# Patient Record
Sex: Female | Born: 1980 | Race: Black or African American | Hispanic: No | Smoking: Current some day smoker
Health system: Southern US, Community
[De-identification: ages and names within clinical notes are randomized; demographics above are authoritative.]

## PROBLEM LIST (undated history)

## (undated) DIAGNOSIS — Z3490 Encounter for supervision of normal pregnancy, unspecified, unspecified trimester: Secondary | ICD-10-CM

## (undated) DIAGNOSIS — Z5189 Encounter for other specified aftercare: Secondary | ICD-10-CM

## (undated) DIAGNOSIS — D649 Anemia, unspecified: Secondary | ICD-10-CM

## (undated) HISTORY — PX: TUBAL LIGATION: SHX77

## (undated) HISTORY — PX: ABDOMINAL HYSTERECTOMY: SHX81

---

## 2003-11-29 ENCOUNTER — Emergency Department (HOSPITAL_COMMUNITY): Admission: EM | Admit: 2003-11-29 | Discharge: 2003-11-29 | Payer: Self-pay | Admitting: Emergency Medicine

## 2004-06-12 ENCOUNTER — Emergency Department (HOSPITAL_COMMUNITY): Admission: EM | Admit: 2004-06-12 | Discharge: 2004-06-12 | Payer: Self-pay | Admitting: Emergency Medicine

## 2005-04-28 ENCOUNTER — Ambulatory Visit (HOSPITAL_COMMUNITY): Admission: AD | Admit: 2005-04-28 | Discharge: 2005-04-28 | Payer: Self-pay | Admitting: Obstetrics & Gynecology

## 2006-07-23 ENCOUNTER — Emergency Department (HOSPITAL_COMMUNITY): Admission: EM | Admit: 2006-07-23 | Discharge: 2006-07-23 | Payer: Self-pay | Admitting: Emergency Medicine

## 2007-02-19 ENCOUNTER — Emergency Department (HOSPITAL_COMMUNITY): Admission: EM | Admit: 2007-02-19 | Discharge: 2007-02-19 | Payer: Self-pay | Admitting: Emergency Medicine

## 2007-12-25 ENCOUNTER — Emergency Department (HOSPITAL_COMMUNITY): Admission: EM | Admit: 2007-12-25 | Discharge: 2007-12-25 | Payer: Self-pay | Admitting: Emergency Medicine

## 2008-05-05 ENCOUNTER — Emergency Department (HOSPITAL_COMMUNITY): Admission: EM | Admit: 2008-05-05 | Discharge: 2008-05-05 | Payer: Self-pay | Admitting: Emergency Medicine

## 2008-05-26 ENCOUNTER — Emergency Department (HOSPITAL_COMMUNITY): Admission: EM | Admit: 2008-05-26 | Discharge: 2008-05-26 | Payer: Self-pay | Admitting: Emergency Medicine

## 2010-01-05 ENCOUNTER — Emergency Department (HOSPITAL_COMMUNITY): Admission: EM | Admit: 2010-01-05 | Discharge: 2010-01-05 | Payer: Self-pay | Admitting: Emergency Medicine

## 2010-05-23 LAB — IRON AND TIBC
Iron: <10 ug/dL — ABNORMAL LOW (ref 42–135)
UIBC: 446 ug/dL

## 2010-05-23 LAB — COMPREHENSIVE METABOLIC PANEL
ALT: 23 U/L (ref 0–35)
AST: 26 U/L (ref 0–37)
Albumin: 3.6 g/dL (ref 3.5–5.2)
CO2: 27 mEq/L (ref 19–32)
Calcium: 8.7 mg/dL (ref 8.4–10.5)
Chloride: 106 mEq/L (ref 96–112)
Creatinine, Ser: 0.54 mg/dL (ref 0.4–1.2)
GFR calc Af Amer: >60 mL/min (ref >60)
GFR calc non Af Amer: >60 mL/min (ref >60)
Sodium: 137 mEq/L (ref 135–145)

## 2010-05-23 LAB — CBC
MCHC: 30.6 g/dL (ref 30.0–36.0)
Platelets: 154 10*3/uL (ref 150–400)
RBC: 3.52 MIL/uL — ABNORMAL LOW (ref 3.87–5.11)
WBC: 5 10*3/uL (ref 4.0–10.5)

## 2010-05-23 LAB — PREGNANCY, URINE: Preg Test, Ur: NEGATIVE

## 2010-05-23 LAB — URINALYSIS, ROUTINE W REFLEX MICROSCOPIC
Nitrite: NEGATIVE
Protein, ur: NEGATIVE mg/dL
Specific Gravity, Urine: 1.02 (ref 1.005–1.030)
Urobilinogen, UA: 0.2 mg/dL (ref 0.0–1.0)

## 2010-05-23 LAB — DIFFERENTIAL
Band Neutrophils: 0 % (ref 0–10)
Basophils Absolute: 0 10*3/uL (ref 0.0–0.1)
Basophils Relative: 0 % (ref 0–1)
Blasts: 0 %
Eosinophils Absolute: 0.2 10*3/uL (ref 0.0–0.7)
Eosinophils Relative: 4 % (ref 0–5)
Lymphocytes Relative: 33 % (ref 12–46)
Lymphs Abs: 1.7 10*3/uL (ref 0.7–4.0)
Metamyelocytes Relative: 0 %
Monocytes Absolute: 0.4 10*3/uL (ref 0.1–1.0)
Monocytes Relative: 8 % (ref 3–12)

## 2010-05-23 LAB — WET PREP, GENITAL: Yeast Wet Prep HPF POC: NONE SEEN

## 2010-05-23 LAB — VITAMIN B12: Vitamin B-12: 595 pg/mL (ref 211–911)

## 2010-05-23 LAB — FERRITIN: Ferritin: <1 ng/mL — ABNORMAL LOW (ref 10–291)

## 2010-05-23 LAB — RETICULOCYTES: RBC.: 3.39 MIL/uL — ABNORMAL LOW (ref 3.87–5.11)

## 2010-11-29 LAB — COMPREHENSIVE METABOLIC PANEL
ALT: 8
Albumin: 2.9 — ABNORMAL LOW
BUN: 1 — ABNORMAL LOW
Calcium: 8.7
Glucose, Bld: 95
Sodium: 136
Total Protein: 6.2

## 2010-11-29 LAB — RPR: RPR Ser Ql: NONREACTIVE

## 2010-11-29 LAB — WET PREP, GENITAL
Clue Cells Wet Prep HPF POC: NONE SEEN
Yeast Wet Prep HPF POC: NONE SEEN

## 2010-11-29 LAB — CBC
Hemoglobin: 9.3 — ABNORMAL LOW
MCHC: 34
Platelets: 228
RDW: 17.7 — ABNORMAL HIGH

## 2010-11-29 LAB — URINALYSIS, ROUTINE W REFLEX MICROSCOPIC
Glucose, UA: NEGATIVE
Hgb urine dipstick: NEGATIVE
Specific Gravity, Urine: 1.015
pH: 7.5

## 2010-11-29 LAB — DIFFERENTIAL
Lymphs Abs: 1
Monocytes Absolute: 0.4
Monocytes Relative: 7
Neutro Abs: 3.5
Neutrophils Relative %: 71

## 2010-11-29 LAB — GC/CHLAMYDIA PROBE AMP, GENITAL: Chlamydia, DNA Probe: NEGATIVE

## 2011-10-08 ENCOUNTER — Encounter (HOSPITAL_COMMUNITY): Payer: Self-pay | Admitting: Emergency Medicine

## 2011-10-08 ENCOUNTER — Inpatient Hospital Stay (HOSPITAL_COMMUNITY)
Admission: EM | Admit: 2011-10-08 | Discharge: 2011-10-10 | DRG: 812 | Disposition: A | Discharge status: Home / Self Care | Payer: Medicaid Other | Attending: Obstetrics and Gynecology | Admitting: Obstetrics and Gynecology

## 2011-10-08 DIAGNOSIS — D259 Leiomyoma of uterus, unspecified: Secondary | ICD-10-CM | POA: Diagnosis present

## 2011-10-08 DIAGNOSIS — D509 Iron deficiency anemia, unspecified: Secondary | ICD-10-CM | POA: Diagnosis present

## 2011-10-08 DIAGNOSIS — N938 Other specified abnormal uterine and vaginal bleeding: Secondary | ICD-10-CM | POA: Diagnosis present

## 2011-10-08 DIAGNOSIS — D649 Anemia, unspecified: Secondary | ICD-10-CM

## 2011-10-08 DIAGNOSIS — F17200 Nicotine dependence, unspecified, uncomplicated: Secondary | ICD-10-CM | POA: Diagnosis present

## 2011-10-08 DIAGNOSIS — N949 Unspecified condition associated with female genital organs and menstrual cycle: Secondary | ICD-10-CM | POA: Diagnosis present

## 2011-10-08 DIAGNOSIS — R51 Headache: Secondary | ICD-10-CM | POA: Diagnosis present

## 2011-10-08 DIAGNOSIS — D62 Acute posthemorrhagic anemia: Principal | ICD-10-CM | POA: Diagnosis present

## 2011-10-08 DIAGNOSIS — D500 Iron deficiency anemia secondary to blood loss (chronic): Secondary | ICD-10-CM | POA: Diagnosis present

## 2011-10-08 HISTORY — DX: Anemia, unspecified: D64.9

## 2011-10-08 LAB — CBC WITH DIFFERENTIAL/PLATELET
Basophils Relative: 0 % (ref 0–1)
Eosinophils Relative: 2 % (ref 0–5)
HCT: 12.7 % — ABNORMAL LOW (ref 36.0–46.0)
Hemoglobin: 3.4 g/dL — CL (ref 12.0–15.0)
Lymphs Abs: 2.4 10*3/uL (ref 0.7–4.0)
MCV: 64.8 fL — ABNORMAL LOW (ref 78.0–100.0)
Monocytes Relative: 9 % (ref 3–12)
Neutro Abs: 5.7 10*3/uL (ref 1.7–7.7)
Platelets: 52 10*3/uL — ABNORMAL LOW (ref 150–400)
RBC: 1.96 MIL/uL — ABNORMAL LOW (ref 3.87–5.11)
WBC: 9.1 10*3/uL (ref 4.0–10.5)

## 2011-10-08 LAB — IRON AND TIBC: UIBC: 570 ug/dL — ABNORMAL HIGH (ref 125–400)

## 2011-10-08 LAB — COMPREHENSIVE METABOLIC PANEL
Albumin: 3.9 g/dL (ref 3.5–5.2)
Alkaline Phosphatase: 73 U/L (ref 39–117)
BUN: 8 mg/dL (ref 6–23)
CO2: 23 mEq/L (ref 19–32)
Chloride: 103 mEq/L (ref 96–112)
Potassium: 3.8 mEq/L (ref 3.5–5.1)
Total Bilirubin: 0.2 mg/dL — ABNORMAL LOW (ref 0.3–1.2)

## 2011-10-08 LAB — PREGNANCY, URINE: Preg Test, Ur: NEGATIVE

## 2011-10-08 LAB — RETICULOCYTES
RBC.: 1.95 MIL/uL — ABNORMAL LOW (ref 3.87–5.11)
Retic Count, Absolute: 27.3 10*3/uL (ref 19.0–186.0)
Retic Ct Pct: 1.4 % (ref 0.4–3.1)

## 2011-10-08 MED ORDER — DIPHENHYDRAMINE HCL 25 MG PO CAPS
25.0000 mg | ORAL_CAPSULE | Freq: Once | ORAL | Status: AC
  Administered 2011-10-08: 25 mg via ORAL
  Filled 2011-10-08: qty 1

## 2011-10-08 MED ORDER — PRENATAL MULTIVITAMIN CH
1.0000 | ORAL_TABLET | Freq: Every day | ORAL | Status: DC
  Administered 2011-10-09 – 2011-10-10 (×2): 1 via ORAL
  Filled 2011-10-08 (×3): qty 1

## 2011-10-08 MED ORDER — ACETAMINOPHEN 325 MG PO TABS
650.0000 mg | ORAL_TABLET | Freq: Once | ORAL | Status: AC
  Administered 2011-10-08: 650 mg via ORAL
  Filled 2011-10-08: qty 2

## 2011-10-08 MED ORDER — SODIUM CHLORIDE 0.9 % IV SOLN
INTRAVENOUS | Status: DC
  Administered 2011-10-08: 1000 mL via INTRAVENOUS
  Administered 2011-10-09: 20:00:00 via INTRAVENOUS

## 2011-10-08 NOTE — ED Provider Notes (Signed)
History  This chart was scribed for Benny Lennert, MD by Erskine Emery. This patient was seen in room APA01/APA01 and the patient's care was started at 15:09.   CSN: [id number]  Arrival date & time 10/08/11  1210   First MD Initiated Contact with Patient 10/08/11 [phone]      Chief Complaint  Patient presents with  . Vaginal Bleeding    (Consider location/radiation/quality/duration/timing/severity/associated sxs/prior Treatment) [name] [name] is a 31 y.o. female who presents to the Emergency Department complaining of intermittent but gradually worsening vaginal bleeding for the past 2-3 months. Pt reports her LNMP was the first week in July; since then, every 2 weeks she has had profuse vaginal bleeding for about 5-6 days. The current episode (which is the worst yet) started on the 10th of August. Pt reports this time the bleeding has lasted longer, is darker, thicker, and clots are present. Pt reports associated cramping (baseline for her periods), dizziness, fatigue, heart palpitations, and headaches, all aggravated by walking. Pt denies any fever, chills, or abdominal pain. Pt describes her normal periods as heavy and often with clots, but reports these recent episodes of bleeding are much worse. Pt reports she has never seen a physician for this complaint and she is not on birth control because she has had a tubal ligation.  Patient is a 31 y.o. female presenting with vaginal bleeding. The history is provided by the patient. No language interpreter was used.  Vaginal Bleeding This is a recurrent problem. The current episode started more than 1 week ago. The problem occurs constantly. The problem has been gradually worsening. Associated symptoms include headaches. Pertinent negatives include no chest pain and no abdominal pain. The symptoms are aggravated by standing, exertion and walking. Nothing relieves the symptoms. She has tried nothing for the symptoms. The treatment provided no relief.    History reviewed. No pertinent past medical history.  Past Surgical History  Procedure Date  . Tubal ligation     No family history on file.  History  Substance Use Topics  . Smoking status: Current Some Day Smoker  . Smokeless tobacco: Not on file  . Alcohol Use: Yes     occasional    OB History    Grav Para Term Preterm Abortions TAB SAB Ect Mult Living                  Review of Systems  Constitutional: Positive for fatigue. Negative for fever and chills.  HENT: Negative for congestion, sinus pressure and ear discharge.   Eyes: Negative for discharge.  Respiratory: Negative for cough.   Cardiovascular: Positive for palpitations. Negative for chest pain.  Gastrointestinal: Negative for abdominal pain and diarrhea.       Cramping  Genitourinary: Positive for vaginal bleeding. Negative for frequency and hematuria.  Musculoskeletal: Negative for back pain.  Skin: Negative for rash.  Neurological: Positive for dizziness, weakness and headaches. Negative for seizures.  Hematological: Negative.   Psychiatric/Behavioral: Negative for hallucinations.  All other systems reviewed and are negative.    Allergies  Review of patient's allergies indicates no known allergies.  Home Medications  No current outpatient prescriptions on file.  Triage Vitals: BP 113/63  Pulse 102  Temp 98.5 F (36.9 C) (Oral)  Resp 20  Ht 5\' 4"  (1.626 m)  Wt 125 lb (56.7 kg)  BMI 21.46 kg/m2  SpO2 100%  LMP 09/28/2011  Physical Exam  Constitutional: She is oriented to person, place, and time. She appears well-developed.  Looks anemic.   HENT:  Head: Normocephalic and atraumatic.  Eyes: EOM are normal. No scleral icterus.       Conjunctiva are pale  Neck: Neck supple. No thyromegaly present.  Cardiovascular: Regular rhythm.  Tachycardia present.  Exam reveals no gallop and no friction rub.   No murmur heard. Pulmonary/Chest: No stridor. She has no wheezes. She has no rales.  She exhibits no tenderness.  Abdominal: Soft. She exhibits no distension. There is no tenderness. There is no rebound.  Genitourinary: There is bleeding around the vagina. No tenderness around the vagina.       Pelvic exam shows a moderate amount of bleeding and no tenderness.  Musculoskeletal: Normal range of motion. She exhibits no edema.  Lymphadenopathy:    She has no cervical adenopathy.  Neurological: She is oriented to person, place, and time. Coordination normal.  Skin: No rash noted. No erythema.  Psychiatric: She has a normal mood and affect. Her behavior is normal.    ED Course  Procedures (including critical care time) DIAGNOSTIC STUDIES: Oxygen Saturation is 100% on room air, normal by my interpretation.    COORDINATION OF CARE: 15:15--I evaluated the patient and we discussed a treatment plan including blood work, urinalysis, and pelvic exam to which the pt agreed.   16:15--I reviewed the patient's labs, which show her hemoglobin is very low, at 3.4.   16:40--I performed a pelvic examination that shows a moderate amount of bleeding and no tenderness.  Labs Reviewed - No data to display No results found.   No diagnosis found.    MDM      The chart was scribed for me under my direct supervision.  I personally performed the history, physical, and medical decision making and all procedures in the evaluation of this patient.Benny Lennert, MD 10/08/11 Rickey Primus

## 2011-10-08 NOTE — ED Provider Notes (Signed)
[name] [name] is an 31 y.o. female. Have severe anemia with hemoglobin 3.4 at the time when she was seen at [facility] emergency to for fatigue after heavy bleeding x 10 days, the third long menses in the last 50 days. She is a gravida 3 para 3 status post tubal ligation recently moved to [place] from [place] where she still has Medicaid. She has not her medical records in Medicaid to this state.  She has noted progressive limitations in energy and ablility too care for her children.  Pertinent Gynecological History:  Menses: flow is excessive with use of Both pads or tampons on heaviest days  Bleeding: dysfunctional uterine bleeding  Contraception: tubal ligation  DES exposure: unknown  Blood transfusions: none , will be admitted for transfusions at this time  Sexually transmitted diseases: no past history  Previous GYN Procedures: Tubal ligation, last Pap smear 4 years ago, last GYN exam 4 years ago  Last mammogram: Not applicable Date:  Last pap: normal Date: 4 years ago  OB History: G 4, P 4  Menstrual History:  Menarche age  Patient's last menstrual period was 09/28/2011. 3 heavy periods and lasts for 2 days. History of intermittent anemia. Hemoglobin here in April was 6 and previous hemoglobin 2009 was 9.3 patient denies any history of blood disorder   History reviewed. No pertinent past medical history.    Past Surgical History    Procedure  Date    .  Tubal ligation      No family history on file.  Social History: reports that she has been smoking. She does not have any smokeless tobacco history on file. She reports that she drinks alcohol. She reports that she does not use illicit drugs.  On further questioning patient acknowledges cocaine use "2 months ago" she reports that she was a negative test today denies methamphetamine use,or any other illicit substance she specifically denies marijuana use  Allergies: No Known Allergies   (Not in a hospital admission)  ROS    Blood pressure 114/55, pulse 112, temperature 98.5 F (36.9 C), temperature source Oral, resp. rate 20, height 5' 4" (1.626 m), weight 56.7 kg (125 lb), last menstrual period 09/28/2011, SpO2 100.00%.  Physical Exam  Physical Examination: General appearance - alert, well appearing, and in no distress and with complaints of fatigue. Resting comfortably in bed with obvious pallor  Mental status - alert, oriented to person, place, and time, normal mood, behavior, speech, dress, motor activity, and thought processes  Eyes - pupils equal and reactive, extraocular eye movements intact  Mouth - mucous membranes moist, pharynx normal without lesions and Extremely pale mucous membranes without lesion  Abdomen - soft, nontender, nondistended, no masses or organomegaly  Breasts -  Pelvic - VULVA: normal appearing vulva with no masses, tenderness or lesions, VAGINA: normal appearing vagina with normal color and discharge, no lesions, watery dark blood coming from the vagina with clots in the cervix, CERVIX: normal appearing cervix without discharge or lesions, multiparous os, losing very thin watery blood and some clots dark, UTERUS: enlarged to 12 week size, irregular, mobile, consistent with fibroid enlargement of the uterus uterus is very E. elongate midplane nontender, ADNEXA: normal adnexa in size, nontender and no masses, exam chaperoned by registered nurse    Results for orders placed during the hospital encounter of 10/08/11 (from the past 24 hour(s))    PREGNANCY, URINE Status: Normal     Collection Time     10/08/11 3:27 PM      Component  Value  Range     Preg Test, Ur  NEGATIVE  NEGATIVE    CBC WITH DIFFERENTIAL Status: Abnormal     Collection Time     10/08/11 3:31 PM    Component  Value  Range     WBC  9.1  4.0 - 10.5 K/uL     RBC  1.96 (*)  3.87 - 5.11 MIL/uL     Hemoglobin  3.4 (*)  12.0 - 15.0 g/dL     HCT  12.7 (*)  36.0 - 46.0 %     MCV  64.8 (*)  78.0 - 100.0 fL     MCH  17.3 (*)  26.0  - 34.0 pg     MCHC  26.8 (*)  30.0 - 36.0 g/dL     RDW  34.8 (*)  11.5 - 15.5 %     Platelets  52 (*)  150 - 400 K/uL     Neutrophils Relative  63  43 - 77 %     Lymphocytes Relative  26  12 - 46 %     Monocytes Relative  9  3 - 12 %     Eosinophils Relative  2  0 - 5 %     Basophils Relative  0  0 - 1 %     Neutro Abs  5.7  1.7 - 7.7 K/uL     Lymphs Abs  2.4  0.7 - 4.0 K/uL     Monocytes Absolute  0.8  0.1 - 1.0 K/uL     Eosinophils Absolute  0.2  0.0 - 0.7 K/uL     Basophils Absolute  0.0  0.0 - 0.1 K/uL     RBC Morphology  POLYCHROMASIA PRESENT      Smear Review  PLATELET COUNT CONFIRMED BY SMEAR     COMPREHENSIVE METABOLIC PANEL Status: Abnormal     Collection Time     10/08/11 3:31 PM    Component  Value  Range     Sodium  136  135 - 145 mEq/L     Potassium  3.8  3.5 - 5.1 mEq/L     Chloride  103  96 - 112 mEq/L     CO2  23  19 - 32 mEq/L     Glucose, Bld  101 (*)  70 - 99 mg/dL     BUN  8  6 - 23 mg/dL     Creatinine, Ser  0.95  0.50 - 1.10 mg/dL     Calcium  9.2  8.4 - 10.5 mg/dL     Total Protein  7.6  6.0 - 8.3 g/dL     Albumin  3.9  3.5 - 5.2 g/dL     AST  18  0 - 37 U/L     ALT  15  0 - 35 U/L     Alkaline Phosphatase  73  39 - 117 U/L     Total Bilirubin  0.2 (*)  0.3 - 1.2 mg/dL     GFR calc non Af Amer  80 (*)  >90 mL/min     GFR calc Af Amer  >90  >90 mL/min    RETICULOCYTES Status: Abnormal     Collection Time     10/08/11 3:31 PM    Component  Value  Range     Retic Ct Pct  1.4  0.4 - 3.1 %     RBC.  1.95 (*)  3.87 - 5.11   MIL/uL     Retic Count, Manual  27.3  19.0 - 186.0 K/uL    TYPE AND SCREEN Status: Normal (Preliminary result)     Collection Time     10/08/11 4:59 PM    Component  Value  Range     ABO/RH(D)  B POS      Antibody Screen  NEG      Sample Expiration  10/11/2011      Unit Number  [id number]      Blood Component Type  RED CELLS,LR      Unit division  00      Status of Unit  ALLOCATED      Transfusion Status  OK TO TRANSFUSE       Crossmatch Result  Compatible      Unit Number  [id number]      Blood Component Type  RED CELLS,LR      Unit division  00      Status of Unit  ALLOCATED      Transfusion Status  OK TO TRANSFUSE      Crossmatch Result  Compatible     PREPARE RBC (CROSSMATCH) Status: Normal     Collection Time     10/08/11 5:06 PM    Component  Value  Range     Order Confirmation  ORDER PROCESSED BY BLOOD BANK     ABO/RH Status: Normal     Collection Time     10/08/11 5:10 PM    Component  Value  Range     ABO/RH(D)  B POS      No results found.  Assessment/Plan:  Severe anemia, likely due to dysfunctional bleeding, associated with fibroid enlargement of uterine  Plan: Will admit, obtain anemia panel, obtain ultrasound tomorrow, transfuse 4 units packed cells overnight, administer Megace 40 mg by mouth 3 times a day overnight, and move to IV Premarin if bleeding continues tomorrow l  Santino Kinsella V  10/08/2011, 6:31 PM          

## 2011-10-08 NOTE — ED Notes (Signed)
VSS Tolerating blood; No rxn IV assessment WNL

## 2011-10-08 NOTE — ED Notes (Signed)
Pt tolerated first unit of PRBCs without problems. No rx noted

## 2011-10-08 NOTE — ED Notes (Signed)
Pt c/o vaginal bleeding x 10 days with dizziness and malaise.

## 2011-10-08 NOTE — ED Notes (Signed)
Pt assisted to [facility] tolerated well gait weak and pt states slight dizziness. Pt passed multiple clots

## 2011-10-08 NOTE — H&P (Signed)
[name] [name] is an 31 y.o. female. Have severe anemia with hemoglobin 3.4 at the time when she was seen at [facility] emergency to for fatigue after heavy bleeding x 10 days, the third long menses in the last 50 days. She is a gravida 3 para 3 status post tubal ligation recently moved to [place] from [place] where she still has Medicaid. She has not her medical records in [place] to this state.  She has noted progressive limitations in energy and ablility too care for her children.  Pertinent Gynecological History:  Menses: flow is excessive with use of Both pads or tampons on heaviest days  Bleeding: dysfunctional uterine bleeding  Contraception: tubal ligation  DES exposure: unknown  Blood transfusions: none , will be admitted for transfusions at this time  Sexually transmitted diseases: no past history  Previous GYN Procedures: Tubal ligation, last Pap smear 4 years ago, last GYN exam 4 years ago  Last mammogram: Not applicable Date:  Last pap: normal Date: 4 years ago  OB History: G 4, P 4  Menstrual History:  Menarche age  Patient's last menstrual period was 09/28/2011. 3 heavy periods and lasts for 2 days. History of intermittent anemia. Hemoglobin here in April was 6 and previous hemoglobin 2009 was 9.3 patient denies any history of blood disorder   History reviewed. No pertinent past medical history.    Past Surgical History    Procedure  Date    .  Tubal ligation      No family history on file.  Social History: reports that she has been smoking. She does not have any smokeless tobacco history on file. She reports that she drinks alcohol. She reports that she does not use illicit drugs.  On further questioning patient acknowledges cocaine use "2 months ago" she reports that she was a negative test today denies methamphetamine use,or any other illicit substance she specifically denies marijuana use  Allergies: No Known Allergies   (Not in a hospital admission)  ROS    Blood pressure 114/55, pulse 112, temperature 98.5 F (36.9 C), temperature source Oral, resp. rate 20, height 5\' 4"  (1.626 m), weight 56.7 kg (125 lb), last menstrual period 09/28/2011, SpO2 100.00%.  Physical Exam  Physical Examination: General appearance - alert, well appearing, and in no distress and with complaints of fatigue. Resting comfortably in bed with obvious pallor  Mental status - alert, oriented to person, place, and time, normal mood, behavior, speech, dress, motor activity, and thought processes  Eyes - pupils equal and reactive, extraocular eye movements intact  Mouth - mucous membranes moist, pharynx normal without lesions and Extremely pale mucous membranes without lesion  Abdomen - soft, nontender, nondistended, no masses or organomegaly  Breasts -  Pelvic - VULVA: normal appearing vulva with no masses, tenderness or lesions, VAGINA: normal appearing vagina with normal color and discharge, no lesions, watery dark blood coming from the vagina with clots in the cervix, CERVIX: normal appearing cervix without discharge or lesions, multiparous os, losing very thin watery blood and some clots dark, UTERUS: enlarged to 12 week size, irregular, mobile, consistent with fibroid enlargement of the uterus uterus is very E. elongate midplane nontender, ADNEXA: normal adnexa in size, nontender and no masses, exam chaperoned by registered nurse    Results for orders placed during the hospital encounter of 10/08/11 (from the past 24 hour(s))    PREGNANCY, URINE Status: Normal     Collection Time     10/08/11 3:27 PM  Component  Value  Range     Preg Test, Ur  NEGATIVE  NEGATIVE    CBC WITH DIFFERENTIAL Status: Abnormal     Collection Time     10/08/11 3:31 PM    Component  Value  Range     WBC  9.1  4.0 - 10.5 K/uL     RBC  1.96 (*)  3.87 - 5.11 MIL/uL     Hemoglobin  3.4 (*)  12.0 - 15.0 g/dL     HCT  30.8 (*)  65.7 - 46.0 %     MCV  64.8 (*)  78.0 - 100.0 fL     MCH  17.3 (*)  26.0  - 34.0 pg     MCHC  26.8 (*)  30.0 - 36.0 g/dL     RDW  84.6 (*)  96.2 - 15.5 %     Platelets  52 (*)  150 - 400 K/uL     Neutrophils Relative  63  43 - 77 %     Lymphocytes Relative  26  12 - 46 %     Monocytes Relative  9  3 - 12 %     Eosinophils Relative  2  0 - 5 %     Basophils Relative  0  0 - 1 %     Neutro Abs  5.7  1.7 - 7.7 K/uL     Lymphs Abs  2.4  0.7 - 4.0 K/uL     Monocytes Absolute  0.8  0.1 - 1.0 K/uL     Eosinophils Absolute  0.2  0.0 - 0.7 K/uL     Basophils Absolute  0.0  0.0 - 0.1 K/uL     RBC Morphology  POLYCHROMASIA PRESENT      Smear Review  PLATELET COUNT CONFIRMED BY SMEAR     COMPREHENSIVE METABOLIC PANEL Status: Abnormal     Collection Time     10/08/11 3:31 PM    Component  Value  Range     Sodium  136  135 - 145 mEq/L     Potassium  3.8  3.5 - 5.1 mEq/L     Chloride  103  96 - 112 mEq/L     CO2  23  19 - 32 mEq/L     Glucose, Bld  101 (*)  70 - 99 mg/dL     BUN  8  6 - 23 mg/dL     Creatinine, Ser  9.52  0.50 - 1.10 mg/dL     Calcium  9.2  8.4 - 10.5 mg/dL     Total Protein  7.6  6.0 - 8.3 g/dL     Albumin  3.9  3.5 - 5.2 g/dL     AST  18  0 - 37 U/L     ALT  15  0 - 35 U/L     Alkaline Phosphatase  73  39 - 117 U/L     Total Bilirubin  0.2 (*)  0.3 - 1.2 mg/dL     GFR calc non Af Amer  80 (*)  >90 mL/min     GFR calc Af Amer  >90  >90 mL/min    RETICULOCYTES Status: Abnormal     Collection Time     10/08/11 3:31 PM    Component  Value  Range     Retic Ct Pct  1.4  0.4 - 3.1 %     RBC.  1.95 (*)  3.87 - 5.11  MIL/uL     Retic Count, Manual  27.3  19.0 - 186.0 K/uL    TYPE AND SCREEN Status: Normal (Preliminary result)     Collection Time     10/08/11 4:59 PM    Component  Value  Range     ABO/RH(D)  B POS      Antibody Screen  NEG      Sample Expiration  10/11/2011      Unit Number  [id number]      Blood Component Type  RED CELLS,LR      Unit division  00      Status of Unit  ALLOCATED      Transfusion Status  OK TO TRANSFUSE       Crossmatch Result  Compatible      Unit Number  [id number]      Blood Component Type  RED CELLS,LR      Unit division  00      Status of Unit  ALLOCATED      Transfusion Status  OK TO TRANSFUSE      Crossmatch Result  Compatible     PREPARE RBC (CROSSMATCH) Status: Normal     Collection Time     10/08/11 5:06 PM    Component  Value  Range     Order Confirmation  ORDER PROCESSED BY BLOOD BANK     ABO/RH Status: Normal     Collection Time     10/08/11 5:10 PM    Component  Value  Range     ABO/RH(D)  B POS      No results found.  Assessment/Plan:  Severe anemia, likely due to dysfunctional bleeding, associated with fibroid enlargement of uterine  Plan: Will admit, obtain anemia panel, obtain ultrasound tomorrow, transfuse 4 units packed cells overnight, administer Megace 40 mg by mouth 3 times a day overnight, and move to IV Premarin if bleeding continues tomorrow l  Baraka Klatt V  10/08/2011, 6:31 PM

## 2011-10-08 NOTE — ED Notes (Signed)
Attempted to call report RN in an emergent situation with a critical pt RN to call back for report

## 2011-10-08 NOTE — ED Notes (Addendum)
Lab called with critical HGB 3.4 and Hematocrit 12.7  Notified edp

## 2011-10-08 NOTE — Progress Notes (Signed)
Called for report, ER nurse starting IV, will call back.

## 2011-10-08 NOTE — ED Notes (Signed)
No reaction Tolerating blood with out problems IV assessment WNL Education provided

## 2011-10-08 NOTE — ED Notes (Addendum)
Pt tolerating blood without rx. Rate increased to 150 VSS IV site WNL Education provided

## 2011-10-09 ENCOUNTER — Inpatient Hospital Stay (HOSPITAL_COMMUNITY): Payer: Medicaid Other

## 2011-10-09 LAB — CBC
MCH: 25.5 pg — ABNORMAL LOW (ref 26.0–34.0)
MCHC: 32.6 g/dL (ref 30.0–36.0)
MCV: 78 fL (ref 78.0–100.0)

## 2011-10-09 LAB — FOLATE: Folate: 13.7 ng/mL

## 2011-10-09 LAB — FERRITIN: Ferritin: <1 ng/mL — ABNORMAL LOW (ref 10–291)

## 2011-10-09 MED ORDER — MEGESTROL ACETATE 40 MG PO TABS
ORAL_TABLET | ORAL | Status: AC
  Filled 2011-10-09: qty 1

## 2011-10-09 MED ORDER — ONDANSETRON HCL 4 MG/2ML IJ SOLN: 4.0000 mg | Freq: Four times a day (QID) | INTRAMUSCULAR | Status: DC | PRN

## 2011-10-09 MED ORDER — SODIUM CHLORIDE 0.9 % IJ SOLN
INTRAMUSCULAR | Status: AC
  Administered 2011-10-09: 10 mL
  Filled 2011-10-09: qty 3

## 2011-10-09 MED ORDER — MEGESTROL ACETATE 40 MG PO TABS
40.0000 mg | ORAL_TABLET | Freq: Three times a day (TID) | ORAL | Status: DC
  Administered 2011-10-09 – 2011-10-10 (×4): 40 mg via ORAL
  Filled 2011-10-09 (×7): qty 1

## 2011-10-09 MED ORDER — HYDROCODONE-ACETAMINOPHEN 5-325 MG PO TABS
1.0000 | ORAL_TABLET | Freq: Four times a day (QID) | ORAL | Status: DC | PRN
  Administered 2011-10-09: 1 via ORAL
  Filled 2011-10-09: qty 1

## 2011-10-09 MED ORDER — MEGESTROL ACETATE 40 MG PO TABS
40.0000 mg | ORAL_TABLET | Freq: Once | ORAL | Status: AC
  Administered 2011-10-09: 40 mg via ORAL
  Filled 2011-10-09: qty 1

## 2011-10-09 MED ORDER — ESTROGENS CONJUGATED 25 MG IJ SOLR
25.0000 mg | Freq: Four times a day (QID) | INTRAMUSCULAR | Status: DC | PRN
  Administered 2011-10-09 – 2011-10-10 (×4): 25 mg via INTRAVENOUS
  Filled 2011-10-09 (×6): qty 25

## 2011-10-09 NOTE — Progress Notes (Signed)
Subjective:H As not had ultrasound warm to scheduled for this afternoon, with post transfusion CBC is still to be drawn on standpoint. She will be transferred to the routine floor. transfusions could have been performed on the routine floor she will likely not qualify for ICU. and Patient reports continued bleeding. She's been begun on Premarin IV.    Objective: I have reviewed patient's vital signs.     Assessment/Plan: Uterine bleeding with severe anemia, probable uterine fibroids status post transfusion x4 units packed cells. Will transfer to routine floor and the IV Premarin every 6 hours with antiemetic.  LOS: 1 day    Jakaden Ouzts V 10/09/2011, 12:33 PM

## 2011-10-09 NOTE — Care Management Note (Unsigned)
    Page 1 of 1   10/09/2011     2:36:53 PM   CARE MANAGEMENT NOTE 10/09/2011  Patient:  [name] [name], [name] [name]   Account Number:  [id number]  Date Initiated:  10/09/2011  Documentation initiated by:  Sharrie Rothman  Subjective/Objective Assessment:   Pt admitted from home with anemia and vaginal bleeding. Pt is visiting from [place] but is staying with her mother while visiting in [place]. Pt stated that she will be staying here another month to have surgery.     Action/Plan:   CM stressed to pt that she should contact Social Services about possibly having Medicaid switched from [place] to [place] while here. Will follow for any HH/CM needs.   Anticipated DC Date:  10/10/2011   Anticipated DC Plan:  HOME/SELF CARE      DC Planning Services  CM consult      Choice offered to / List presented to:             Status of service:  In process, will continue to follow Medicare Important Message given?   (If response is "NO", the following Medicare IM given date fields will be blank) Date Medicare IM given:   Date Additional Medicare IM given:    Discharge Disposition:    Per UR Regulation:    If discussed at Long Length of Stay Meetings, dates discussed:    Comments:  10/09/11 1436 Arlyss Queen, RN BSN CM

## 2011-10-09 NOTE — Progress Notes (Signed)
Report given to [place], [place]. Patient being transferred to room 212. Patient alert, oriented and in stable condition at the time of transport. Patient being transported in wheelchair to new room.

## 2011-10-09 NOTE — Progress Notes (Signed)
Subjective: Patient reports tolerating PO.  Still passing clots.. She has completed 4 units packed cells and as much better color and energy. Will be scheduled today for ultrasound. Will begin IV Premarin this morning every 6 hours with Phenergan as well  Objective: I have reviewed patient's vital signs.  Vaginal Bleeding: moderate   Assessment/Plan: Dysfunctional uterine bleeding with severe anemia status post 4 units packed cells GYN ultrasound this morning IV Premarin to be initiated will transfer out of ICU and patient unable to be discharged home by midday  LOS: 1 day    [name] [name] V 10/09/2011, 8:37 AM

## 2011-10-09 NOTE — Progress Notes (Signed)
UR Chart Review Completed  

## 2011-10-10 LAB — TYPE AND SCREEN: Unit division: 0

## 2011-10-10 MED ORDER — SODIUM CHLORIDE 0.9 % IJ SOLN
INTRAMUSCULAR | Status: AC
  Administered 2011-10-10: 10 mL
  Filled 2011-10-10: qty 3

## 2011-10-10 MED ORDER — MEGESTROL ACETATE 40 MG PO TABS: 40.0000 mg | ORAL_TABLET | Freq: Three times a day (TID) | ORAL | Status: AC

## 2011-10-10 NOTE — Discharge Summary (Addendum)
Subjective: Patient reports tolerating PO and no problems voiding.  She denies any current bleeding  Objective: I have reviewed patient's vital signs, medications and labs. CBC    Component Value Date/Time   WBC 10.3 10/09/2011 1237   RBC 3.69* 10/09/2011 1237   HGB 9.4* 10/09/2011 1237   HCT 28.8* 10/09/2011 1237   PLT 57* 10/09/2011 1237   MCV 78.0 10/09/2011 1237   MCH 25.5* 10/09/2011 1237   MCHC 32.6 10/09/2011 1237   RDW 34.8* 10/08/2011 1531   LYMPHSABS 2.4 10/08/2011 1531   MONOABS 0.8 10/08/2011 1531   EOSABS 0.2 10/08/2011 1531   BASOSABS 0.0 10/08/2011 1531     General: alert, no distress and sleeping in this morninng GI: soft, non-tender; bowel sounds normal; no masses,  no organomegaly Vaginal Bleeding: none  US Pelvis Complete   Status: Final result       PACS Images     Show images for US Pelvis Complete      Study Result     *RADIOLOGY REPORT*   Clinical Data: Dysfunctional uterine bleeding, anemia.  Bilateral tubal ligation.   TRANSABDOMINAL AND TRANSVAGINAL ULTRASOUND OF PELVIS   Technique:  Both transabdominal and transvaginal ultrasound examinations of the pelvis were performed.  Transabdominal technique was performed for global imaging of the pelvis including uterus, ovaries, adnexal regions, and pelvic cul-de-sac.   It was necessary to proceed with endovaginal exam following the transabdominal exam to visualize the right ovary.   Comparison:  Early OB ultrasound 07/23/2006   Findings: Uterus:  9.0 x 6.7 x 4.9 cm.  Anteverted, anteflexed.  No focal abnormality.   Endometrium: 4 mm.  Uniformly thin and echogenic.  Right ovary: Normal appearance/no adnexal mass  Left ovary: Normal appearance/no adnexal mass  Other Findings:  No free fluid  IMPRESSION: Normal study.  No evidence of pelvic mass or other significant abnormality.   Original Report Authenticated By: Harrel Lemon, M.D.     Authorizing: Tilda Burrow, MD    Assessment/Plan: Resolved Dub Anemia  Acute,  Probable Chronic anemia ,  LOS: 2 days    Mitsuru Dault V 10/10/2011, 10:44 AM

## 2011-10-10 NOTE — Progress Notes (Signed)
Discharge Summary: a/o.vss. Up ad lib. Saline lock removed. No complaints of distress. Discharge instructions given. Prescriptions given. Pt verbalized understanding of instructions. Awaiting for family to arrive for discharge.

## 2012-07-01 ENCOUNTER — Emergency Department (HOSPITAL_COMMUNITY): Payer: Medicaid Other

## 2012-07-01 ENCOUNTER — Encounter (HOSPITAL_COMMUNITY): Payer: Self-pay

## 2012-07-01 ENCOUNTER — Emergency Department (HOSPITAL_COMMUNITY)
Admission: EM | Admit: 2012-07-01 | Discharge: 2012-07-01 | Disposition: A | Discharge status: Home / Self Care | Payer: Medicaid Other | Attending: Emergency Medicine | Admitting: Emergency Medicine

## 2012-07-01 DIAGNOSIS — Z862 Personal history of diseases of the blood and blood-forming organs and certain disorders involving the immune mechanism: Secondary | ICD-10-CM | POA: Insufficient documentation

## 2012-07-01 DIAGNOSIS — B9689 Other specified bacterial agents as the cause of diseases classified elsewhere: Secondary | ICD-10-CM

## 2012-07-01 DIAGNOSIS — O210 Mild hyperemesis gravidarum: Secondary | ICD-10-CM | POA: Insufficient documentation

## 2012-07-01 DIAGNOSIS — O9989 Other specified diseases and conditions complicating pregnancy, childbirth and the puerperium: Secondary | ICD-10-CM | POA: Insufficient documentation

## 2012-07-01 DIAGNOSIS — O99330 Smoking (tobacco) complicating pregnancy, unspecified trimester: Secondary | ICD-10-CM | POA: Insufficient documentation

## 2012-07-01 DIAGNOSIS — N760 Acute vaginitis: Secondary | ICD-10-CM | POA: Insufficient documentation

## 2012-07-01 DIAGNOSIS — Z9851 Tubal ligation status: Secondary | ICD-10-CM | POA: Insufficient documentation

## 2012-07-01 DIAGNOSIS — Z3490 Encounter for supervision of normal pregnancy, unspecified, unspecified trimester: Secondary | ICD-10-CM

## 2012-07-01 DIAGNOSIS — R109 Unspecified abdominal pain: Secondary | ICD-10-CM | POA: Insufficient documentation

## 2012-07-01 DIAGNOSIS — N898 Other specified noninflammatory disorders of vagina: Secondary | ICD-10-CM | POA: Insufficient documentation

## 2012-07-01 DIAGNOSIS — O2390 Unspecified genitourinary tract infection in pregnancy, unspecified trimester: Secondary | ICD-10-CM | POA: Insufficient documentation

## 2012-07-01 DIAGNOSIS — R110 Nausea: Secondary | ICD-10-CM

## 2012-07-01 LAB — CBC
Hemoglobin: 10.9 g/dL — ABNORMAL LOW (ref 12.0–15.0)
MCH: 25.6 pg — ABNORMAL LOW (ref 26.0–34.0)
MCHC: 32.2 g/dL (ref 30.0–36.0)
MCV: 79.3 fL (ref 78.0–100.0)
RBC: 4.26 MIL/uL (ref 3.87–5.11)

## 2012-07-01 LAB — URINALYSIS, ROUTINE W REFLEX MICROSCOPIC
Glucose, UA: NEGATIVE mg/dL
Hgb urine dipstick: NEGATIVE
Ketones, ur: NEGATIVE mg/dL
Leukocytes, UA: NEGATIVE
Protein, ur: NEGATIVE mg/dL
Urobilinogen, UA: 0.2 mg/dL (ref 0.0–1.0)

## 2012-07-01 LAB — PREGNANCY, URINE: Preg Test, Ur: POSITIVE — AB

## 2012-07-01 LAB — WET PREP, GENITAL

## 2012-07-01 MED ORDER — METRONIDAZOLE 500 MG PO TABS: 500.0000 mg | ORAL_TABLET | Freq: Two times a day (BID) | ORAL | Status: DC

## 2012-07-01 MED ORDER — ONDANSETRON HCL 4 MG PO TABS: 4.0000 mg | ORAL_TABLET | Freq: Three times a day (TID) | ORAL | Status: DC | PRN

## 2012-07-01 MED ORDER — PRENATAL COMPLETE 14-0.4 MG PO TABS: 1.0000 | ORAL_TABLET | Freq: Every day | ORAL | Status: DC

## 2012-07-01 MED ORDER — ONDANSETRON HCL 4 MG/2ML IJ SOLN
4.0000 mg | INTRAMUSCULAR | Status: DC | PRN
  Administered 2012-07-01: 4 mg via INTRAVENOUS
  Filled 2012-07-01: qty 2

## 2012-07-01 NOTE — ED Notes (Signed)
Pt complain of nausea. States she has had two positive pregnancy test. Tubes have been tied.

## 2012-07-01 NOTE — ED Notes (Signed)
Pt reports her last normal period march 10-11th, and has taken 2 pregnancy tests that were positive.  She had her tubes tied 5 years ago, has been having ab cramping at times. Is supposed to have her uterus removed at some point because she is having heavy vaginal bleeding at times that required blood transfusions in the past.

## 2012-07-01 NOTE — ED Provider Notes (Signed)
History     CSN: [id number]  Arrival date & time 07/01/12  1749   First MD Initiated Contact with Patient 07/01/12 1801      Chief Complaint  Patient presents with  . Morning Sickness     HPI Pt was seen at 1840.   Per pt, c/o gradual onset and persistence of constant nausea for the past 1 month. Has been associated with vaginal discharge.  Pt states she has taken 2 home pregnancy tests and they were "both positive."  States she occasionally has had a very brief, "sharp, pinching, cramping pain" in her lower abd. Pt endorses hx of tubal ligation and heavy menses, for which she is scheduled to have a hysterectomy. Hx G4P3, LMP approx 04/20/12 with EGA [redacted] weeks and 2/7 days. Denies vaginal bleeding, no pelvic pain, no back/flank pain, no vomiting/diarrhea, no dysuria, no CP/SOB, no fevers.     OB/GYN: Dr. Emelda Fear Past Medical History  Diagnosis Date  . Anemia     Past Surgical History  Procedure Laterality Date  . Tubal ligation       History  Substance Use Topics  . Smoking status: Current Some Day Smoker -- 0.25 packs/day for 5 years    Types: Cigarettes  . Smokeless tobacco: Never Used  . Alcohol Use: Yes     Comment: occasional    OB History   Grav Para Term Preterm Abortions TAB SAB Ect Mult Living   4 3              Review of Systems ROS: Statement: All systems negative except as marked or noted in the HPI; Constitutional: Negative for fever and chills. ; ; Eyes: Negative for eye pain, redness and discharge. ; ; ENMT: Negative for ear pain, hoarseness, nasal congestion, sinus pressure and sore throat. ; ; Cardiovascular: Negative for chest pain, palpitations, diaphoresis, dyspnea and peripheral edema. ; ; Respiratory: Negative for cough, wheezing and stridor. ; ; Gastrointestinal: +nausea. Negative for vomiting, diarrhea, abdominal pain, blood in stool, hematemesis, jaundice and rectal bleeding. . ; ; Genitourinary: Negative for dysuria, flank pain and hematuria. ;  ; GYN:  No vaginal bleeding, +vaginal discharge, no vulvar pain.;; Musculoskeletal: Negative for back pain and neck pain. Negative for swelling and trauma.; ; Skin: Negative for pruritus, rash, abrasions, blisters, bruising and skin lesion.; ; Neuro: Negative for headache, lightheadedness and neck stiffness. Negative for weakness, altered level of consciousness , altered mental status, extremity weakness, paresthesias, involuntary movement, seizure and syncope.       Allergies  Review of patient's allergies indicates no known allergies.  Home Medications  No current outpatient prescriptions on file.  BP 121/75  Pulse 95  Temp(Src) 98.6 F (37 C) (Oral)  Resp 18  Ht 5\' 4"  (1.626 m)  Wt 130 lb (58.968 kg)  BMI 22.3 kg/m2  SpO2 98%  LMP 04/20/2012  Physical Exam 1845: Physical examination:  Nursing notes reviewed; Vital signs and O2 SAT reviewed;  Constitutional: Well developed, Well nourished, Well hydrated, In no acute distress; Head:  Normocephalic, atraumatic; Eyes: EOMI, PERRL, No scleral icterus; ENMT: Mouth and pharynx normal, Mucous membranes moist; Neck: Supple, Full range of motion, No lymphadenopathy; Cardiovascular: Regular rate and rhythm, No murmur, rub, or gallop; Respiratory: Breath sounds clear & equal bilaterally, No rales, rhonchi, wheezes.  Speaking full sentences with ease, Normal respiratory effort/excursion; Chest: Nontender, Movement normal; Abdomen: Soft, Nontender, Nondistended, Normal bowel sounds; Genitourinary: No CVA tenderness. Pelvic exam performed with permission of pt and female  ED tech assist during exam.  External genitalia w/o lesions. Vaginal vault with thick white discharge.  Cervix w/o lesions, not friable, GC/chlam and wet prep obtained and sent to lab.  Bimanual exam w/o CMT, uterine or adnexal tenderness.;; Extremities: Pulses normal, No tenderness, No edema, No calf edema or asymmetry.; Neuro: AA&Ox3, Major CN grossly intact.  Speech clear. Climbs on  and off stretcher easily by herself. Gait steady. No gross focal motor or sensory deficits in extremities.; Skin: Color normal, Warm, Dry.   ED Course  Procedures    MDM  MDM Reviewed: previous chart, nursing note and vitals Reviewed previous: labs Interpretation: labs and ultrasound   Results for ANTOINE, FIALLOS (MRN [id number]) as of 07/01/2012 19:26  Ref. Range 10/08/2011 15:31 10/09/2011 12:37 07/01/2012 18:45  Hemoglobin Latest Range: 12.0-15.0 g/dL 3.4 (LL) 9.4 (L) 40.9 (L)  HCT Latest Range: 36.0-46.0 % 12.7 (L) 28.8 (L) 33.8 (L)     Results for orders placed during the hospital encounter of 07/01/12  WET PREP, GENITAL      Result Value Range   Yeast Wet Prep HPF POC NONE SEEN  NONE SEEN   Trich, Wet Prep NONE SEEN  NONE SEEN   Clue Cells Wet Prep HPF POC FEW (*) NONE SEEN   WBC, Wet Prep HPF POC FEW (*) NONE SEEN  URINALYSIS, ROUTINE W REFLEX MICROSCOPIC      Result Value Range   Color, Urine YELLOW  YELLOW   APPearance CLEAR  CLEAR   Specific Gravity, Urine <1.005 (*) 1.005 - 1.030   pH 6.5  5.0 - 8.0   Glucose, UA NEGATIVE  NEGATIVE mg/dL   Hgb urine dipstick NEGATIVE  NEGATIVE   Bilirubin Urine NEGATIVE  NEGATIVE   Ketones, ur NEGATIVE  NEGATIVE mg/dL   Protein, ur NEGATIVE  NEGATIVE mg/dL   Urobilinogen, UA 0.2  0.0 - 1.0 mg/dL   Nitrite NEGATIVE  NEGATIVE   Leukocytes, UA NEGATIVE  NEGATIVE  PREGNANCY, URINE      Result Value Range   Preg Test, Ur POSITIVE (*) NEGATIVE  HCG, QUANTITATIVE, PREGNANCY      Result Value Range   hCG, Beta Chain, Quant, S 54458 (*) <5 mIU/mL  CBC      Result Value Range   WBC 6.8  4.0 - 10.5 K/uL   RBC 4.26  3.87 - 5.11 MIL/uL   Hemoglobin 10.9 (*) 12.0 - 15.0 g/dL   HCT 81.1 (*) 91.4 - 78.2 %   MCV 79.3  78.0 - 100.0 fL   MCH 25.6 (*) 26.0 - 34.0 pg   MCHC 32.2  30.0 - 36.0 g/dL   RDW 95.6 (*) 21.3 - 08.6 %   Platelets 209  150 - 400 K/uL   US Ob Comp Less 14 Wks 07/01/2012   *RADIOLOGY REPORT*  Clinical Data: Positive  pregnancy test, abdominal cramping, nausea, past history of tubal ligation, question ectopic pregnancy  OBSTETRIC <14 WK [place] AND TRANSVAGINAL OB US  Technique:  Both transabdominal and transvaginal ultrasound examinations were performed for complete evaluation of the gestation as well as the maternal uterus, adnexal regions, and pelvic cul-de-sac.  Transvaginal technique was performed to assess early pregnancy.  Comparison:  Pelvic and transvaginal ultrasound 10/09/2011  Intrauterine gestational sac:  Visualized/normal in shape. Yolk sac: Present Embryo: Present Cardiac Activity: Present Heart Rate: 168 bpm  CRL: 17.2  mm      8 w  1 d         [place] EDC: 02/09/2013  Maternal uterus/adnexae: Physiologic chorioamniotic separation noted. No definite subchorionic hemorrhage. Trace amount of free pelvic fluid. Unremarkable ovaries. No adnexal masses  IMPRESSION: Single live early intrauterine gestation measured at 8 weeks 1 day EGA by crown-rump length. No acute abnormalities.   Original Report Authenticated By: Ulyses Southward, M.D.    2120:  VSS, resps easy, ambulatory with steady gait.  Abd/pelvic exam remains benign.  +IUP on [place].  No vaginal bleeding. EPIC chart reviewed: pt is B positive per labs 09/2011. Will tx for +BV, GC/chlam pending.  Pt wants to go home now.  Dx and testing d/w pt.  Questions answered.  Verb understanding, agreeable to d/c home with outpt f/u.      Laray Anger, DO 07/03/12 1842

## 2012-07-01 NOTE — ED Notes (Signed)
Sitting upright on stretcher in no apparent distress. Color good, w/d. Offers no complaint. Waiting for sono.

## 2012-07-02 LAB — URINE CULTURE
Colony Count: NO GROWTH
Culture: NO GROWTH

## 2012-07-29 ENCOUNTER — Other Ambulatory Visit: Payer: Self-pay | Admitting: Obstetrics & Gynecology

## 2012-07-29 ENCOUNTER — Encounter: Payer: Self-pay | Admitting: *Deleted

## 2012-07-29 DIAGNOSIS — O3680X0 Pregnancy with inconclusive fetal viability, not applicable or unspecified: Secondary | ICD-10-CM

## 2012-07-30 ENCOUNTER — Other Ambulatory Visit: Payer: Self-pay | Admitting: Obstetrics & Gynecology

## 2012-07-30 ENCOUNTER — Ambulatory Visit (INDEPENDENT_AMBULATORY_CARE_PROVIDER_SITE_OTHER): Payer: Medicaid Other

## 2012-07-30 DIAGNOSIS — Z36 Encounter for antenatal screening of mother: Secondary | ICD-10-CM

## 2012-07-30 DIAGNOSIS — O3680X0 Pregnancy with inconclusive fetal viability, not applicable or unspecified: Secondary | ICD-10-CM

## 2012-07-30 NOTE — Progress Notes (Signed)
U/S(12+2wks)-single IUP with +FCA noted, CRL c/w EDD given by U/S at [facility], cx long and closed, bilateral adnexa wnl, NB present, NT=1.30mm

## 2012-08-04 LAB — MATERNAL SCREEN, INTEGRATED #1

## 2012-08-10 ENCOUNTER — Encounter: Payer: Medicaid Other | Admitting: Adult Health

## 2012-09-14 ENCOUNTER — Encounter: Payer: Medicaid Other | Admitting: Adult Health

## 2012-09-14 ENCOUNTER — Encounter: Payer: Self-pay | Admitting: *Deleted

## 2012-09-30 ENCOUNTER — Other Ambulatory Visit (HOSPITAL_COMMUNITY)
Admission: RE | Admit: 2012-09-30 | Discharge: 2012-09-30 | Disposition: A | Discharge status: Home / Self Care | Payer: Medicaid Other | Source: Ambulatory Visit | Attending: Advanced Practice Midwife | Admitting: Advanced Practice Midwife

## 2012-09-30 ENCOUNTER — Encounter: Payer: Self-pay | Admitting: Advanced Practice Midwife

## 2012-09-30 ENCOUNTER — Ambulatory Visit (INDEPENDENT_AMBULATORY_CARE_PROVIDER_SITE_OTHER): Payer: Medicaid Other | Admitting: Advanced Practice Midwife

## 2012-09-30 ENCOUNTER — Other Ambulatory Visit: Payer: Self-pay | Admitting: Advanced Practice Midwife

## 2012-09-30 VITALS — BP 112/72 | Wt 141.0 lb

## 2012-09-30 DIAGNOSIS — Z01419 Encounter for gynecological examination (general) (routine) without abnormal findings: Secondary | ICD-10-CM | POA: Insufficient documentation

## 2012-09-30 DIAGNOSIS — O0932 Supervision of pregnancy with insufficient antenatal care, second trimester: Secondary | ICD-10-CM

## 2012-09-30 DIAGNOSIS — R87810 Cervical high risk human papillomavirus (HPV) DNA test positive: Secondary | ICD-10-CM | POA: Insufficient documentation

## 2012-09-30 DIAGNOSIS — O0930 Supervision of pregnancy with insufficient antenatal care, unspecified trimester: Secondary | ICD-10-CM

## 2012-09-30 DIAGNOSIS — Z3482 Encounter for supervision of other normal pregnancy, second trimester: Secondary | ICD-10-CM

## 2012-09-30 DIAGNOSIS — Z1389 Encounter for screening for other disorder: Secondary | ICD-10-CM

## 2012-09-30 DIAGNOSIS — Z3490 Encounter for supervision of normal pregnancy, unspecified, unspecified trimester: Secondary | ICD-10-CM | POA: Insufficient documentation

## 2012-09-30 DIAGNOSIS — Z331 Pregnant state, incidental: Secondary | ICD-10-CM

## 2012-09-30 DIAGNOSIS — Z113 Encounter for screening for infections with a predominantly sexual mode of transmission: Secondary | ICD-10-CM | POA: Insufficient documentation

## 2012-09-30 DIAGNOSIS — Z1151 Encounter for screening for human papillomavirus (HPV): Secondary | ICD-10-CM | POA: Insufficient documentation

## 2012-09-30 HISTORY — DX: Supervision of pregnancy with insufficient antenatal care, second trimester: O09.32

## 2012-09-30 LAB — POCT URINALYSIS DIPSTICK
Glucose, UA: NEGATIVE
Leukocytes, UA: NEGATIVE
Nitrite, UA: NEGATIVE

## 2012-09-30 NOTE — Progress Notes (Addendum)
  Subjective:    [name] [name] is a G4P3 [redacted]w[redacted]d being seen today for her first obstetrical visit.  Her obstetrical history is significant for nothing.  Pregnancy history fully reviewed.  Pt had a BTL in 2008.  Patient reports backache and hemorrhoids. Nausea, not helped by zofran Filed Vitals:   09/30/12 1345  BP: 112/72  Weight: 141 lb (63.957 kg)    HISTORY: OB History  Gravida Para Term Preterm AB SAB TAB Ectopic Multiple Living  4 3 3       3     # Outcome Date GA Lbr Len/2nd Weight Sex Delivery Anes PTL Lv  4 CUR           3 TRM 12/18/06 [redacted]w[redacted]d  6 lb 5 oz (2.863 kg) M SVD EPI N Y  2 TRM 2007 [redacted]w[redacted]d 08:00 6 lb 7 oz (2.92 kg) F SVD None  Y  1 TRM 04/22/03 [redacted]w[redacted]d  6 lb 5 oz (2.863 kg) M SVD EPI N Y     Past Medical History  Diagnosis Date  . Anemia    Past Surgical History  Procedure Laterality Date  . Tubal ligation     Family History  Problem Relation Age of Onset  . Diabetes Other      Exam       Pelvic Exam:    Perineum: Normal Perineum   Vulva: normal   Vagina:  normal mucosa, normal discharge, no palpable nodules   Uterus   Fundal Height: 21 cm     Cervix: normal   Adnexa: Not palpable   Urinary:  urethral meatus normal    System: Breast:  normal appearance, no masses or tenderness   Skin: normal coloration and turgor, no rashes    Neurologic: oriented, normal, normal mood   Extremities: normal strength, tone, and muscle mass   HEENT PERRLA   Mouth/Teeth mucous membranes moist, pharynx normal without lesions   Neck supple and no masses   Cardiovascular: regular rate and rhythm   Respiratory:  appears well, vitals normal, no respiratory distress, acyanotic, normal RR   Abdomen: soft, non-tender; bowel sounds normal; no masses,  no organomegaly    Several   Non thrombosed hemorrhoids . Pt "thinks they are ugly."      Assessment:    Pregnancy: G4P3 Patient Active Problem List   Diagnosis Date Noted  . Late prenatal care complicating pregnancy  in second trimester 09/30/2012  . Pregnant 09/30/2012  . Anemia 10/08/2011        Plan:     Initial labs drawn. Prenatal vitamins. Problem list reviewed and updated. Genetic Screening discussed Integrated Screen: requested.  Ultrasound discussed; fetal survey: requested. Diclegis samples  Follow up in 1 weeks.  [name] [name] 09/30/2012

## 2012-09-30 NOTE — Addendum Note (Signed)
Addended by: Jacklyn Shell on: 09/30/2012 02:43 PM   Modules accepted: Orders

## 2012-09-30 NOTE — Progress Notes (Signed)
C/o hemorrhoids, lower abdominal pressure, and lower back pain "feels like something eating away at spine." Pt c/o nausea, Zofran not helping. Pt states missed several appt.

## 2012-09-30 NOTE — Patient Instructions (Signed)

## 2012-10-01 LAB — GC/CHLAMYDIA PROBE AMP, URINE
Chlamydia, Swab/Urine, PCR: NEGATIVE
GC Probe Amp, Urine: NEGATIVE

## 2012-10-01 LAB — DRUG SCREEN, URINE, NO CONFIRMATION
Benzodiazepines.: NEGATIVE
Creatinine,U: 127.8 mg/dL
Marijuana Metabolite: NEGATIVE
Phencyclidine (PCP): NEGATIVE
Propoxyphene: NEGATIVE

## 2012-10-01 LAB — URINALYSIS
Bilirubin Urine: NEGATIVE
Glucose, UA: NEGATIVE mg/dL
Hgb urine dipstick: NEGATIVE
Leukocytes, UA: NEGATIVE
pH: 6.5 (ref 5.0–8.0)

## 2012-10-01 LAB — RUBELLA SCREEN: Rubella: 1.46 Index — ABNORMAL HIGH (ref <0.90)

## 2012-10-01 LAB — VARICELLA ZOSTER ANTIBODY, IGG: Varicella IgG: 293.5 Index — ABNORMAL HIGH (ref <135.00)

## 2012-10-02 LAB — URINE CULTURE: Colony Count: NO GROWTH

## 2012-10-02 NOTE — Addendum Note (Signed)
Addended by: Colen Darling on: 10/02/2012 09:06 AM   Modules accepted: Orders

## 2012-10-05 LAB — MATERNAL SCREEN, INTEGRATED #2
AFP MoM: 0.98
AFP, Serum: 82.7 ng/mL
Age risk Down Syndrome: 1:720 {titer}
Estriol Mom: 0.74
Estriol, Free: 1.8 ng/mL
Nuchal Translucency: 1.3 mm
hCG MoM: 0.38

## 2012-10-09 ENCOUNTER — Other Ambulatory Visit: Payer: Medicaid Other

## 2012-10-09 ENCOUNTER — Encounter: Payer: Medicaid Other | Admitting: Obstetrics & Gynecology

## 2012-10-16 ENCOUNTER — Encounter: Payer: Medicaid Other | Admitting: Women's Health

## 2012-10-16 ENCOUNTER — Ambulatory Visit: Payer: Medicaid Other

## 2012-11-16 ENCOUNTER — Encounter (HOSPITAL_COMMUNITY): Payer: Self-pay | Admitting: *Deleted

## 2012-11-16 ENCOUNTER — Emergency Department (HOSPITAL_COMMUNITY)
Admission: EM | Admit: 2012-11-16 | Discharge: 2012-11-16 | Discharge status: Against Medical Advice | Payer: Medicaid Other | Attending: Emergency Medicine | Admitting: Emergency Medicine

## 2012-11-16 DIAGNOSIS — R1110 Vomiting, unspecified: Secondary | ICD-10-CM | POA: Insufficient documentation

## 2012-11-16 DIAGNOSIS — K089 Disorder of teeth and supporting structures, unspecified: Secondary | ICD-10-CM | POA: Insufficient documentation

## 2012-11-16 DIAGNOSIS — K08109 Complete loss of teeth, unspecified cause, unspecified class: Secondary | ICD-10-CM | POA: Insufficient documentation

## 2012-11-16 DIAGNOSIS — F17200 Nicotine dependence, unspecified, uncomplicated: Secondary | ICD-10-CM | POA: Insufficient documentation

## 2012-11-16 HISTORY — DX: Encounter for other specified aftercare: Z51.89

## 2012-11-16 HISTORY — DX: Encounter for supervision of normal pregnancy, unspecified, unspecified trimester: Z34.90

## 2012-11-16 NOTE — ED Notes (Addendum)
Dental pain , pt removed tooth 10 mos ago, thinks a piece of the tooth remains lt mandibular molar, vomiting.  Has been taking a friends amoxicillin, aleve, tylenol.  Now having abd pain due to all the meds she  Is taking.  7 mos pregnant  Denies problems with pregnancy, plans delivery at [facility].  Says she can see dentist tomorrow. No vag bleeding.

## 2013-06-04 ENCOUNTER — Encounter (HOSPITAL_COMMUNITY): Payer: Self-pay | Admitting: *Deleted

## 2013-06-06 LAB — US OB COMP + 14 WK

## 2013-12-13 ENCOUNTER — Encounter (HOSPITAL_COMMUNITY): Payer: Self-pay | Admitting: *Deleted

## 2014-06-06 ENCOUNTER — Emergency Department (HOSPITAL_COMMUNITY): Payer: Medicaid Other

## 2014-06-06 ENCOUNTER — Emergency Department (HOSPITAL_COMMUNITY)
Admission: EM | Admit: 2014-06-06 | Discharge: 2014-06-06 | Disposition: A | Discharge status: Home / Self Care | Payer: Medicaid Other | Attending: Emergency Medicine | Admitting: Emergency Medicine

## 2014-06-06 ENCOUNTER — Encounter (HOSPITAL_COMMUNITY): Payer: Self-pay | Admitting: Emergency Medicine

## 2014-06-06 DIAGNOSIS — N939 Abnormal uterine and vaginal bleeding, unspecified: Secondary | ICD-10-CM

## 2014-06-06 DIAGNOSIS — D649 Anemia, unspecified: Secondary | ICD-10-CM | POA: Insufficient documentation

## 2014-06-06 DIAGNOSIS — Z3202 Encounter for pregnancy test, result negative: Secondary | ICD-10-CM | POA: Insufficient documentation

## 2014-06-06 DIAGNOSIS — Z79899 Other long term (current) drug therapy: Secondary | ICD-10-CM | POA: Diagnosis not present

## 2014-06-06 DIAGNOSIS — Z720 Tobacco use: Secondary | ICD-10-CM | POA: Insufficient documentation

## 2014-06-06 LAB — CBC WITH DIFFERENTIAL/PLATELET
BASOS ABS: 0 10*3/uL (ref 0.0–0.1)
Basophils Relative: 0 % (ref 0–1)
Eosinophils Absolute: 0.2 10*3/uL (ref 0.0–0.7)
Eosinophils Relative: 3 % (ref 0–5)
HCT: 32.3 % — ABNORMAL LOW (ref 36.0–46.0)
Hemoglobin: 9.3 g/dL — ABNORMAL LOW (ref 12.0–15.0)
LYMPHS ABS: 1.5 10*3/uL (ref 0.7–4.0)
Lymphocytes Relative: 25 % (ref 12–46)
MCH: 20.4 pg — ABNORMAL LOW (ref 26.0–34.0)
MCHC: 28.8 g/dL — AB (ref 30.0–36.0)
MCV: 71 fL — ABNORMAL LOW (ref 78.0–100.0)
MONOS PCT: 7 % (ref 3–12)
Monocytes Absolute: 0.4 10*3/uL (ref 0.1–1.0)
NEUTROS PCT: 65 % (ref 43–77)
Neutro Abs: 4 10*3/uL (ref 1.7–7.7)
PLATELETS: 232 10*3/uL (ref 150–400)
RBC: 4.55 MIL/uL (ref 3.87–5.11)
RDW: 18.8 % — ABNORMAL HIGH (ref 11.5–15.5)
WBC: 6.1 10*3/uL (ref 4.0–10.5)

## 2014-06-06 LAB — COMPREHENSIVE METABOLIC PANEL
ALT: 15 U/L (ref 0–35)
ANION GAP: 5 (ref 5–15)
AST: 21 U/L (ref 0–37)
Albumin: 4.2 g/dL (ref 3.5–5.2)
Alkaline Phosphatase: 111 U/L (ref 39–117)
BILIRUBIN TOTAL: 0.2 mg/dL — AB (ref 0.3–1.2)
BUN: 7 mg/dL (ref 6–23)
CO2: 22 mmol/L (ref 19–32)
Calcium: 8.9 mg/dL (ref 8.4–10.5)
Chloride: 109 mmol/L (ref 96–112)
Creatinine, Ser: 0.68 mg/dL (ref 0.50–1.10)
GFR calc non Af Amer: >90 mL/min (ref >90)
Glucose, Bld: 97 mg/dL (ref 70–99)
Potassium: 3.9 mmol/L (ref 3.5–5.1)
Sodium: 136 mmol/L (ref 135–145)
Total Protein: 7.5 g/dL (ref 6.0–8.3)

## 2014-06-06 LAB — URINALYSIS, ROUTINE W REFLEX MICROSCOPIC
Bilirubin Urine: NEGATIVE
GLUCOSE, UA: NEGATIVE mg/dL
HGB URINE DIPSTICK: NEGATIVE
Ketones, ur: NEGATIVE mg/dL
LEUKOCYTES UA: NEGATIVE
NITRITE: NEGATIVE
PH: 6.5 (ref 5.0–8.0)
Protein, ur: NEGATIVE mg/dL
Specific Gravity, Urine: <1.005 — ABNORMAL LOW (ref 1.005–1.030)
UROBILINOGEN UA: 0.2 mg/dL (ref 0.0–1.0)

## 2014-06-06 LAB — POC URINE PREG, ED: Preg Test, Ur: NEGATIVE

## 2014-06-06 MED ORDER — HYDROCODONE-ACETAMINOPHEN 5-325 MG PO TABS
2.0000 | ORAL_TABLET | Freq: Once | ORAL | Status: AC
  Administered 2014-06-06: 2 via ORAL
  Filled 2014-06-06: qty 2

## 2014-06-06 MED ORDER — MEGESTROL ACETATE 40 MG PO TABS: 40.0000 mg | ORAL_TABLET | Freq: Three times a day (TID) | ORAL | Status: DC

## 2014-06-06 MED ORDER — SODIUM CHLORIDE 0.9 % IV BOLUS (SEPSIS)
1000.0000 mL | Freq: Once | INTRAVENOUS | Status: AC
Start: 2014-06-06 — End: 2014-06-06
  Administered 2014-06-06: 1000 mL via INTRAVENOUS

## 2014-06-06 MED ORDER — MEGESTROL ACETATE 40 MG PO TABS
40.0000 mg | ORAL_TABLET | Freq: Once | ORAL | Status: AC
  Administered 2014-06-06: 40 mg via ORAL
  Filled 2014-06-06: qty 1

## 2014-06-06 MED ORDER — MELOXICAM 15 MG PO TABS: 15.0000 mg | ORAL_TABLET | Freq: Every day | ORAL | Status: DC

## 2014-06-06 MED ORDER — ESTROGENS CONJUGATED 25 MG IJ SOLR
25.0000 mg | Freq: Once | INTRAMUSCULAR | Status: AC
  Administered 2014-06-06: 25 mg via INTRAVENOUS
  Filled 2014-06-06: qty 25

## 2014-06-06 MED ORDER — PROMETHAZINE HCL 12.5 MG PO TABS
12.5000 mg | ORAL_TABLET | Freq: Once | ORAL | Status: AC
  Administered 2014-06-06: 12.5 mg via ORAL
  Filled 2014-06-06: qty 1

## 2014-06-06 NOTE — Discharge Instructions (Signed)
Abnormal Uterine Bleeding YOUR BLOOD WORK IS NON-ACUTE AT THIS TIME. PLEASE USE THE MEGACE THREE TIMES DAILY. SEE MD AT [facility] THAT TOOK CARE OF YOUR LAST DELIVERY AS SOON AS POSSIBLE.Abnormal uterine bleeding means bleeding from the vagina that is not your normal menstrual period. This can be:  Bleeding or spotting between periods.  Bleeding after sex (sexual intercourse).  Bleeding that is heavier or more than normal.  Periods that last longer than usual.  Bleeding after menopause. There are many problems that may cause this. Treatment will depend on the cause of the bleeding. Any kind of bleeding that is not normal should be reviewed by your doctor.  HOME CARE Watch your condition for any changes. These actions may lessen any discomfort you are having:  Do not use tampons or douches as told by your doctor.  Change your pads often. You should get regular pelvic exams and Pap tests. Keep all appointments for tests as told by your doctor. GET HELP IF:  You are bleeding for more than 1 week.  You feel dizzy at times. GET HELP RIGHT AWAY IF:   You pass out.  You have to change pads every 15 to 30 minutes.  You have belly pain.  You have a fever.  You become sweaty or weak.  You are passing large blood clots from the vagina.  You feel sick to your stomach (nauseous) and throw up (vomit). MAKE SURE YOU:  Understand these instructions.  Will watch your condition.  Will get help right away if you are not doing well or get worse. Document Released: 11/25/2008 Document Revised: 02/02/2013 Document Reviewed: 08/27/2012 [facility] Patient Information 2015 [place], [place]. This information is not intended to replace advice given to you by your health care provider. Make sure you discuss any questions you have with your health care provider.

## 2014-06-06 NOTE — ED Provider Notes (Signed)
CSN: [id number]     Arrival date & time 06/06/14  0754 History   First MD Initiated Contact with Patient 06/06/14 [phone]     Chief Complaint  Patient presents with  . Vaginal Bleeding     (Consider location/radiation/quality/duration/timing/severity/associated sxs/prior Treatment) HPI Comments: Patient is a 34 year old female who presents to the emergency department with vaginal bleeding. The patient states that she's been having some spotting type bleeding since January 23 when she delivered her last child. She states that over the last 4 or 5 days she's been having increasing bleeding. At this point she is swelling her third or fourth DP and this morning. She states that it seems as though the blood is "gushing out". The patient states that she had a tubal ligation a few years ago, and has had 2 pregnancies to go to full-term since that time. She states since her previous pregnancies, she has not been told of any fibroid problems or any other complications. She required a hospitalization for severe anemia approximately 2 years ago, at which time her hemoglobin was down to 3.4. The patient is very concerned that with this amount of bleeding that she may get into the severe anemia situation again and she presents to the emergency room. She delivered her last child at the [facility]. She states however that she did not go back to the [facility] in [place] for the follow-up that she was told to have. The patient has had 5 pregnancies and 5 deliveries. Is also of note that she had a Depo-Provera shot in January 2016.    Patient is a 34 y.o. female presenting with vaginal bleeding. The history is provided by the patient.  Vaginal Bleeding Associated symptoms: no abdominal pain, no back pain, no dizziness and no dysuria     Past Medical History  Diagnosis Date  . Anemia   . Pregnant   . Blood transfusion without reported diagnosis    Past Surgical History  Procedure Laterality Date   . Tubal ligation     Family History  Problem Relation Age of Onset  . Diabetes Other    History  Substance Use Topics  . Smoking status: Current Some Day Smoker -- 0.50 packs/day for 5 years    Types: Cigarettes  . Smokeless tobacco: Never Used  . Alcohol Use: Yes     Comment: occasional   OB History    Gravida Para Term Preterm AB TAB SAB Ectopic Multiple Living   6 5 5       3      Review of Systems  Constitutional: Negative for activity change.       All ROS Neg except as noted in HPI  HENT: Negative for nosebleeds.   Eyes: Negative for photophobia and discharge.  Respiratory: Negative for cough, shortness of breath and wheezing.   Cardiovascular: Negative for chest pain and palpitations.  Gastrointestinal: Negative for abdominal pain and blood in stool.  Genitourinary: Positive for vaginal bleeding. Negative for dysuria, frequency and hematuria.  Musculoskeletal: Negative for back pain, arthralgias and neck pain.  Skin: Negative.   Neurological: Negative for dizziness, seizures and speech difficulty.  Psychiatric/Behavioral: Negative for hallucinations and confusion.      Allergies  Review of patient's allergies indicates no known allergies.  Home Medications   Prior to Admission medications   Medication Sig Start Date End Date Taking? Authorizing Provider  ondansetron (ZOFRAN) 4 MG tablet Take 1 tablet (4 mg total) by mouth every 8 (eight) hours  as needed for nausea. 07/01/12   Francine Graven, DO  Prenatal Vit-Fe Fumarate-FA (PRENATAL COMPLETE) 14-0.4 MG TABS Take 1 tablet by mouth daily. 07/01/12   Francine Graven, DO   BP 127/73 mmHg  Pulse 83  Temp(Src) 99.6 F (37.6 C) (Oral)  Resp 16  Ht 5\' 4"  (1.626 m)  Wt 125 lb (56.7 kg)  BMI 21.45 kg/m2  SpO2 99%  LMP 06/06/2014 (Exact Date)  Breastfeeding? No Physical Exam  Constitutional: She is oriented to person, place, and time. She appears well-developed and well-nourished.  Non-toxic appearance.  HENT:   Head: Normocephalic.  Right Ear: Tympanic membrane and external ear normal.  Left Ear: Tympanic membrane and external ear normal.  Eyes: EOM and lids are normal. Pupils are equal, round, and reactive to light.  Neck: Normal range of motion. Neck supple. Carotid bruit is not present.  Cardiovascular: Normal rate, regular rhythm, normal heart sounds, intact distal pulses and normal pulses.   Pulmonary/Chest: Breath sounds normal. No respiratory distress.  Abdominal: Soft. Bowel sounds are normal. There is tenderness. There is no guarding.  There is soreness to palpation of the right and left lower quadrants of the abdomen. No distention, no guarding, and no rebound. No CVA tenderness.  Genitourinary:  Chaperone present during the examination. The patient has active bleeding noted. The external structures show no acute rash or other abnormality. There is no mass noted in the vaginal vault. There are noted of clots in the vaginal vault. The os of the cervix is closed. There is active bleeding from the os of the cervix. There is no adnexal mass, and there is no tenderness to movement of the cervix.  Musculoskeletal: Normal range of motion.  Lymphadenopathy:       Head (right side): No submandibular adenopathy present.       Head (left side): No submandibular adenopathy present.    She has no cervical adenopathy.  Neurological: She is alert and oriented to person, place, and time. She has normal strength. No cranial nerve deficit or sensory deficit.  Skin: Skin is warm and dry.  Psychiatric: She has a normal mood and affect. Her speech is normal.  Nursing note and vitals reviewed.   ED Course      Procedures (including critical care time) Labs Review Labs Reviewed - No data to display  Imaging Review No results found.   EKG Interpretation None      MDM  Urine pregnancy test is negative. Complete blood count shows a white blood cell count 6100, hemoglobin is slightly low at 9.3,  hematocrit slightly low at 32.3, platelets are normal at 232,000. There is no shift to the left. Comprehensive metabolic panel is well within normal limits. Urinalysis is within normal limits. Transvaginal ultrasound shows no finding to describe or explain the patient's abnormal uterine bleeding, in particular there are  no fibroids or no other mass visualized. I discussed the case with Dr. Elonda Husky. He suggests the patient received intravenous Premarin, and oral Megace. The patient is to be placed on Megace by prescription, and follow-up with the physicians who took care of her at the [facility] in [place].. The patient's vital signs are stable, there is no evidence of shock or other acute findings at this time. Feel that it is safe for the patient to be discharged home with very close follow-up by the [facility] in [place].    Final diagnoses:  None    *I have reviewed nursing notes, vital signs, and  all appropriate lab and imaging results for this patient.Lily Kocher, PA-C 06/06/14 [place], MD 06/06/14 1352

## 2014-06-06 NOTE — ED Notes (Signed)
Paged Dr. Glo Herring earlier for H. Marshell Levan.  Then called office and he spoke with Dr. Elonda Husky.

## 2014-06-06 NOTE — ED Notes (Signed)
EDP with pt 

## 2014-06-06 NOTE — ED Notes (Signed)
Pt reports heavy vaginal bleeding, with the exception of a few days, pt states she has bled continuously since delivering on Jan 23. Pt has prior hx of severe anemia from bleeding. Pt received Depo injection on Jan 26, has not helped.

## 2014-06-07 LAB — GC/CHLAMYDIA PROBE AMP (~~LOC~~) NOT AT ARMC
CHLAMYDIA, DNA PROBE: NEGATIVE
Neisseria Gonorrhea: NEGATIVE

## 2014-06-07 LAB — HIV ANTIBODY (ROUTINE TESTING W REFLEX): HIV Screen 4th Generation wRfx: NONREACTIVE

## 2014-06-07 LAB — RPR: RPR Ser Ql: NONREACTIVE

## 2015-01-19 IMAGING — US US OB TRANSVAGINAL
1 series · 14 of 28 positions shown · non-contrast
Comparison: Pelvic and transvaginal ultrasound 10/09/2011

CLINICAL DATA: Positive pregnancy test, abdominal cramping, nausea,
past history of tubal ligation, question ectopic pregnancy

OBSTETRIC <14 WK US AND TRANSVAGINAL OB US
TECHNIQUE: Both transabdominal and transvaginal ultrasound
examinations were performed for complete evaluation of the
gestation as well as the maternal uterus, adnexal regions, and
pelvic cul-de-sac.  Transvaginal technique was performed to assess
early pregnancy.

[Series 1: us ob transvaginal · 0.23mm/px · 14 of 97 slices shown]
[im 4/97]
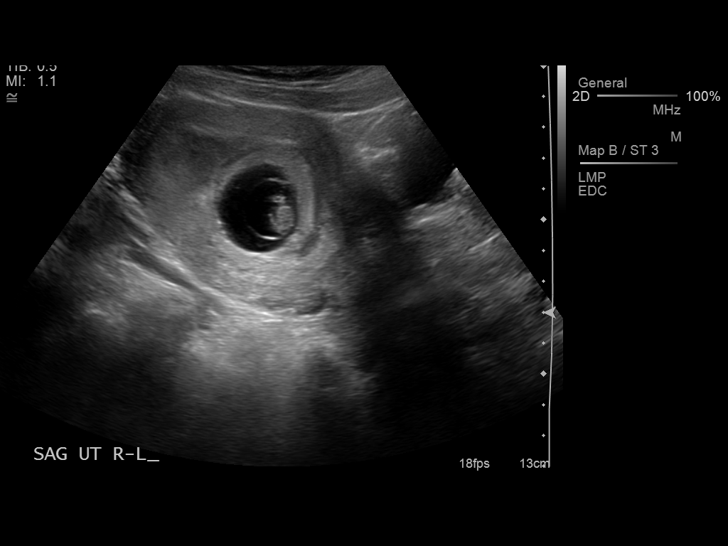
[im 11/97]
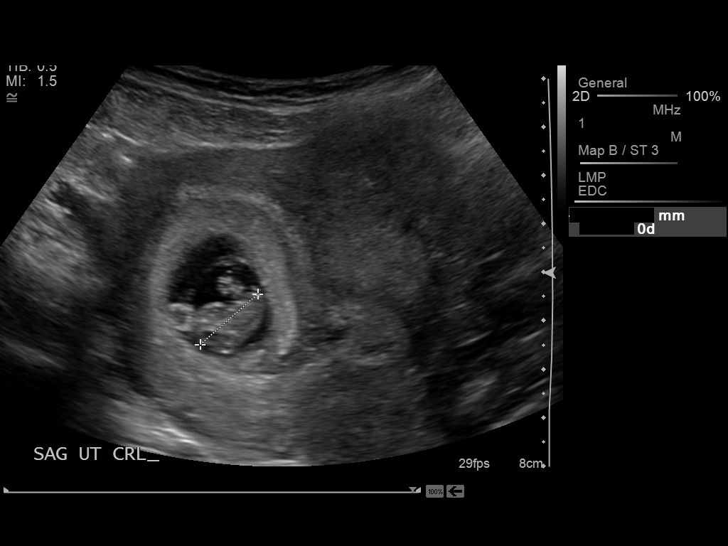
[im 18/97]
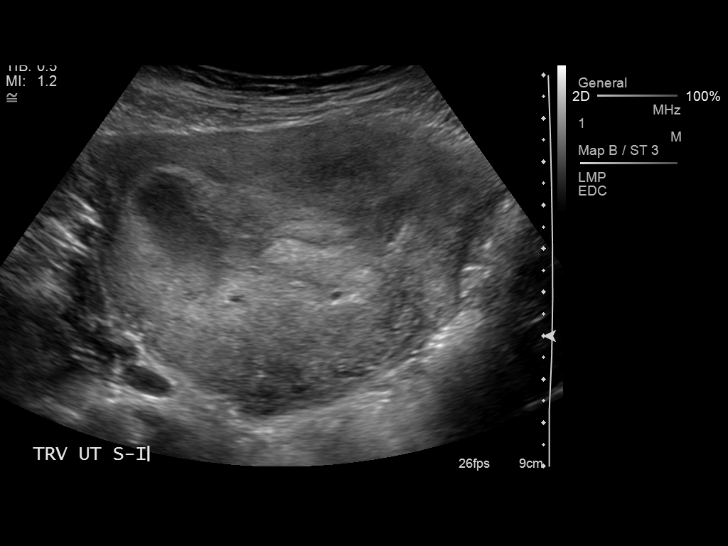
[im 25/97]
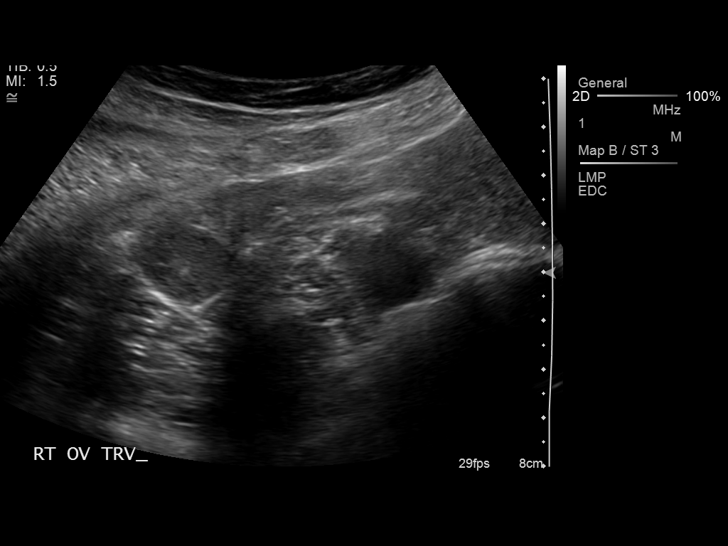
[im 33/97]
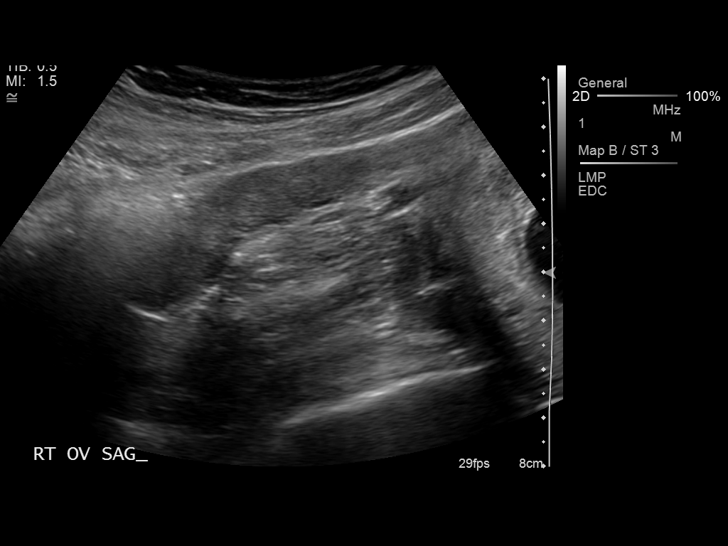
[im 40/97]
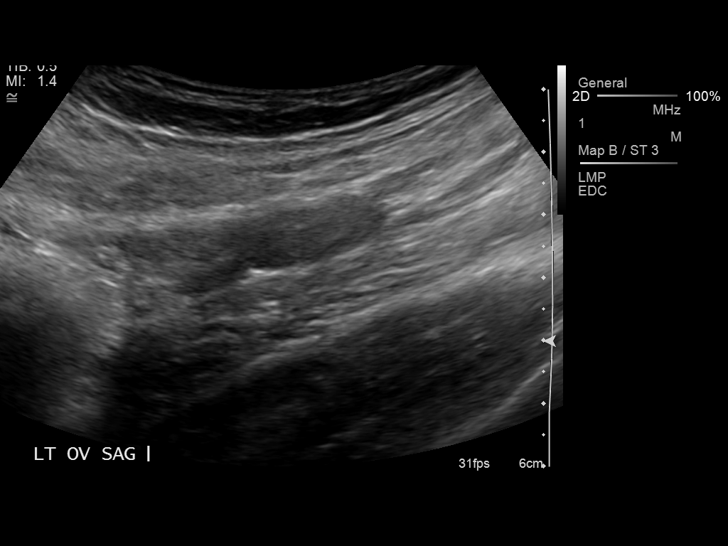
[im 47/97]
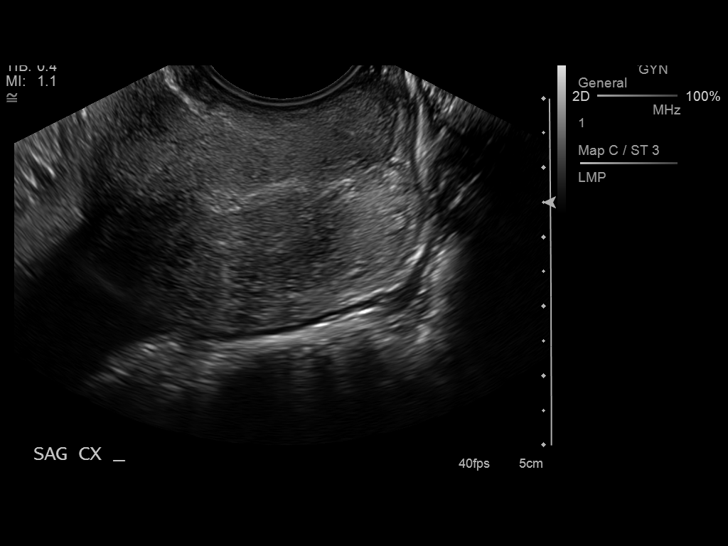
[im 54/97]
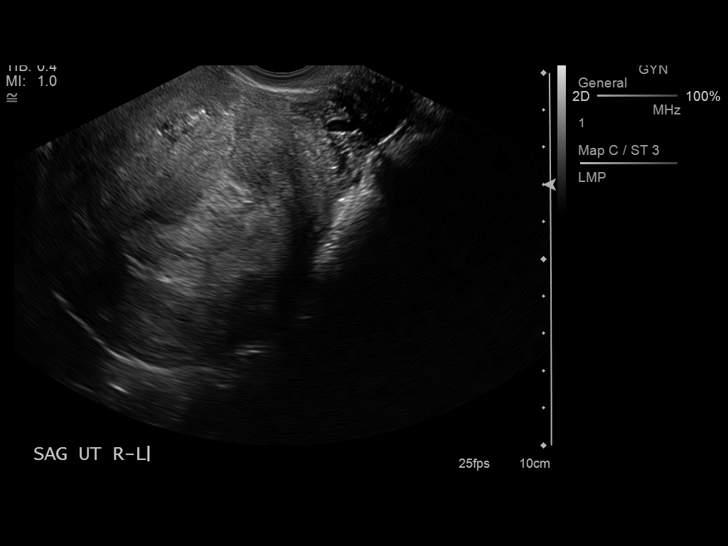
[im 61/97]
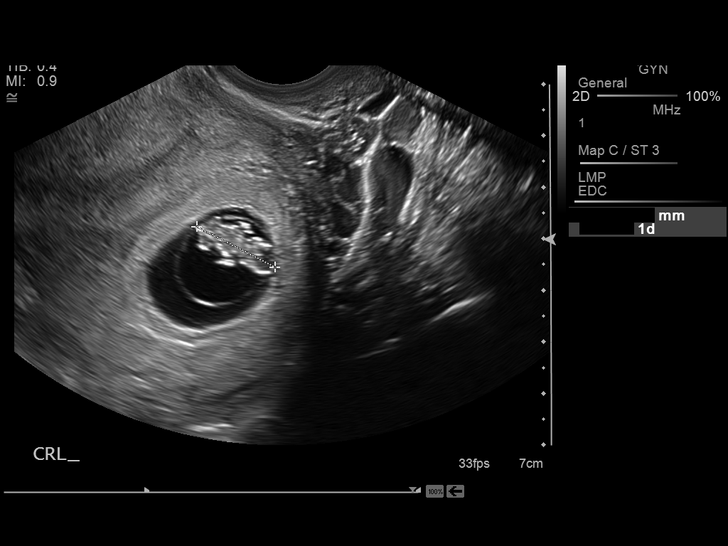
[im 68/97]
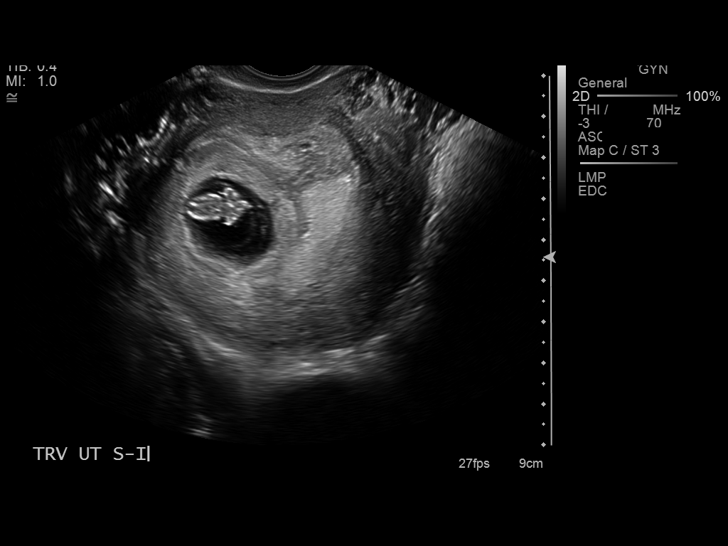
[im 75/97]
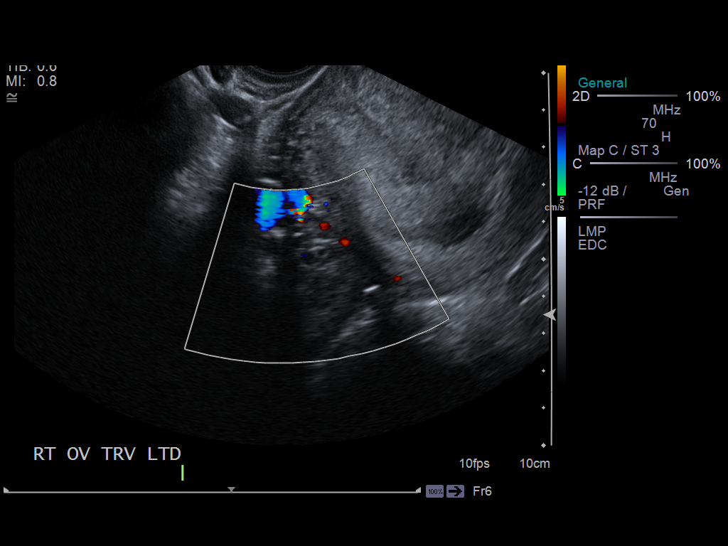
[im 82/97]
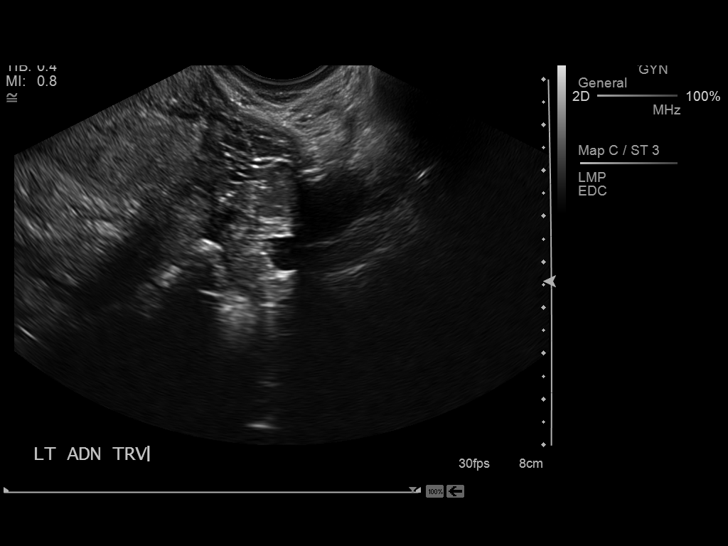
[im 89/97]
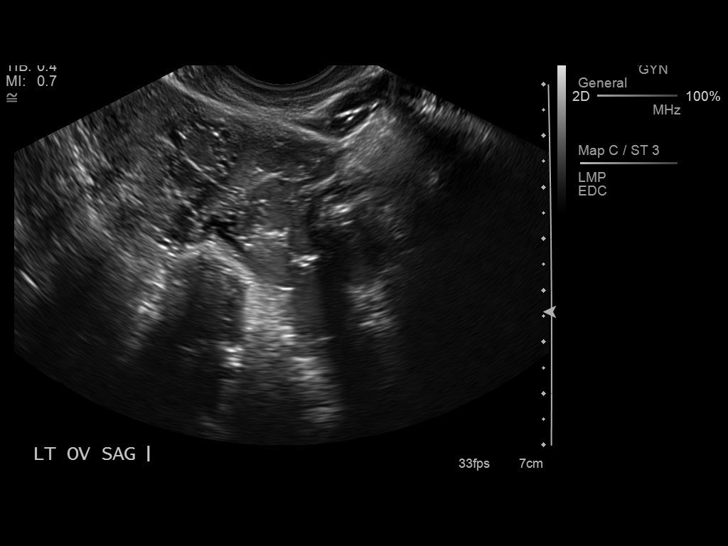
[im 97/97]
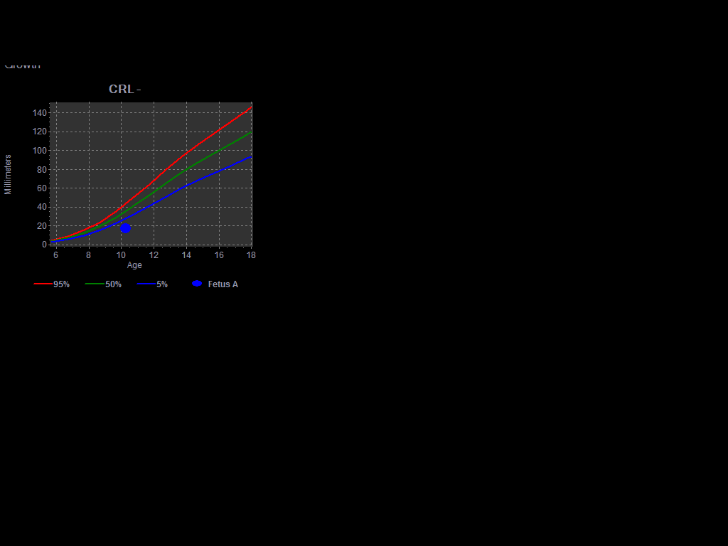

[14 of 28 positions shown; findings below may reference images not displayed]

Intrauterine gestational sac:  Visualized/normal in shape.
Yolk sac: Present
Embryo: Present
Cardiac Activity: Present
Heart Rate: 168 bpm

CRL: 17.2  mm      8 w  1 d         US EDC: 02/09/2013

Maternal uterus/adnexae:
Physiologic chorioamniotic separation noted.
No definite subchorionic hemorrhage.
Trace amount of free pelvic fluid.
Unremarkable ovaries.
No adnexal masses
IMPRESSION: Single live early intrauterine gestation measured at 8 weeks 1 day
EGA by crown-rump length.
No acute abnormalities.

## 2015-06-19 ENCOUNTER — Encounter (HOSPITAL_COMMUNITY): Payer: Self-pay | Admitting: Emergency Medicine

## 2015-06-19 ENCOUNTER — Emergency Department (HOSPITAL_COMMUNITY)
Admission: EM | Admit: 2015-06-19 | Discharge: 2015-06-19 | Disposition: A | Discharge status: Home / Self Care | Payer: Medicaid Other | Attending: Dermatology | Admitting: Dermatology

## 2015-06-19 DIAGNOSIS — R102 Pelvic and perineal pain: Secondary | ICD-10-CM | POA: Diagnosis present

## 2015-06-19 DIAGNOSIS — F17210 Nicotine dependence, cigarettes, uncomplicated: Secondary | ICD-10-CM | POA: Insufficient documentation

## 2015-06-19 DIAGNOSIS — Z5321 Procedure and treatment not carried out due to patient leaving prior to being seen by health care provider: Secondary | ICD-10-CM | POA: Diagnosis not present

## 2015-06-19 NOTE — ED Notes (Addendum)
PT c/o lower abdominal and pelvic pain x2 days and denies any vaginal discharge but states boyfriend had other sexual partner within the past two months. PT denies any urinary symptoms. PT states she had a hysterectomy last year.

## 2016-10-20 ENCOUNTER — Emergency Department (HOSPITAL_COMMUNITY)
Admission: EM | Admit: 2016-10-20 | Discharge: 2016-10-20 | Disposition: A | Discharge status: Home / Self Care | Payer: Medicaid Other | Attending: Emergency Medicine | Admitting: Emergency Medicine

## 2016-10-20 ENCOUNTER — Encounter (HOSPITAL_COMMUNITY): Payer: Self-pay

## 2016-10-20 DIAGNOSIS — N760 Acute vaginitis: Secondary | ICD-10-CM | POA: Diagnosis not present

## 2016-10-20 DIAGNOSIS — Z202 Contact with and (suspected) exposure to infections with a predominantly sexual mode of transmission: Secondary | ICD-10-CM | POA: Diagnosis not present

## 2016-10-20 DIAGNOSIS — F17210 Nicotine dependence, cigarettes, uncomplicated: Secondary | ICD-10-CM | POA: Insufficient documentation

## 2016-10-20 DIAGNOSIS — B9689 Other specified bacterial agents as the cause of diseases classified elsewhere: Secondary | ICD-10-CM

## 2016-10-20 LAB — WET PREP, GENITAL
Sperm: NONE SEEN
Trich, Wet Prep: NONE SEEN
YEAST WET PREP: NONE SEEN

## 2016-10-20 LAB — URINALYSIS, ROUTINE W REFLEX MICROSCOPIC
BILIRUBIN URINE: NEGATIVE
Glucose, UA: NEGATIVE mg/dL
HGB URINE DIPSTICK: NEGATIVE
KETONES UR: NEGATIVE mg/dL
Leukocytes, UA: NEGATIVE
NITRITE: NEGATIVE
PROTEIN: NEGATIVE mg/dL
Specific Gravity, Urine: 1.02 (ref 1.005–1.030)
pH: 6 (ref 5.0–8.0)

## 2016-10-20 LAB — PREGNANCY, URINE: PREG TEST UR: NEGATIVE

## 2016-10-20 MED ORDER — METRONIDAZOLE 500 MG PO TABS: 500.0000 mg | ORAL_TABLET | Freq: Two times a day (BID) | ORAL | 0 refills | Status: AC

## 2016-10-20 NOTE — Discharge Instructions (Signed)
As discussed, avoid sexual intercourse for at least 7 days and until asymptomatic.  You will receive a call if the next few days if any of the labs return positive.  Practice safe sex thereafter using condoms to protect against sexually transmitted infections. Stay well-hydrated and follow up with your Primary care provider. Taking her entire course of antibiotics even if you feel better and avoid alcohol while on this medication.  Return if symptoms worsen, nausea, vomiting, diarrhea, worsening abdominal pain or other concerning symptoms in the meantime.

## 2016-10-20 NOTE — ED Triage Notes (Signed)
Pt reports that her lower abdomen has been cramping since approx Tuesday. Pt noticed vaginal itching for 2 days. Pt recently back with her boyfriend and not sure who he has been with

## 2016-10-20 NOTE — ED Provider Notes (Signed)
[facility] DEPT Provider Note   CSN: [id number] Arrival date & time: 10/20/16  1729     History   Chief Complaint Chief Complaint  Patient presents with  . Abdominal Pain    HPI [name] [name] is a 36 y.o. female presenting with concerns for STD exposure. She reports a history of unprotected intercourse recently. She is having suprapubic discomfort and vaginal pruritus and clear discharge for the last 2 days. Denies dysuria, hematuria, fever, chills, nausea, vomiting, diarrhea or other symptoms. Patient has had hysterectomy. She is unsure is her cervix was preserved.  HPI  Past Medical History:  Diagnosis Date  . Anemia   . Blood transfusion without reported diagnosis   . Pregnant     Patient Active Problem List   Diagnosis Date Noted  . Late prenatal care complicating pregnancy in second trimester 09/30/2012  . Pregnant 09/30/2012  . Anemia 10/08/2011    Past Surgical History:  Procedure Laterality Date  . ABDOMINAL HYSTERECTOMY    . TUBAL LIGATION      OB History    Gravida Para Term Preterm AB Living   6 5 5     5    SAB TAB Ectopic Multiple Live Births           3       Home Medications    Prior to Admission medications   Medication Sig Start Date End Date Taking? Authorizing Provider  metroNIDAZOLE (FLAGYL) 500 MG tablet Take 1 tablet (500 mg total) by mouth 2 (two) times daily. 10/20/16 10/27/16  Emeline General, PA-C    Family History Family History  Problem Relation Age of Onset  . Diabetes Other     Social History Social History  Substance Use Topics  . Smoking status: Current Some Day Smoker    Packs/day: 0.50    Years: 5.00    Types: Cigarettes  . Smokeless tobacco: Never Used  . Alcohol use Yes     Comment: occasional     Allergies   Patient has no known allergies.   Review of Systems Review of Systems  Constitutional: Negative for chills and fever.  Respiratory: Negative for cough, shortness of breath, wheezing and  stridor.   Cardiovascular: Negative for chest pain and palpitations.  Gastrointestinal: Negative for abdominal distention, abdominal pain, diarrhea, nausea and vomiting.  Genitourinary: Positive for pelvic pain and vaginal discharge. Negative for difficulty urinating, dysuria, flank pain, frequency, genital sores, hematuria and menstrual problem.  Musculoskeletal: Negative for arthralgias and back pain.  Skin: Negative for color change, pallor and rash.  Neurological: Negative for seizures and syncope.     Physical Exam Updated Vital Signs BP 119/84 (BP Location: Right Arm)   Pulse 86   Temp 98.2 F (36.8 C) (Oral)   Wt 79.4 kg (175 lb)   LMP 06/06/2014 (Exact Date)   SpO2 99%   BMI 30.04 kg/m   Physical Exam  Constitutional: She appears well-developed and well-nourished. No distress.  Afebrile, nontoxic-appearing, sitting comfortably in bed in no acute distress.  HENT:  Head: Normocephalic and atraumatic.  Eyes: Conjunctivae are normal.  Neck: Neck supple.  Cardiovascular: Normal rate and regular rhythm.   No murmur heard. Pulmonary/Chest: Effort normal and breath sounds normal. No respiratory distress.  Abdominal: Soft. She exhibits no distension and no mass. There is no tenderness. There is no rebound and no guarding.  Genitourinary: Vaginal discharge found.  Genitourinary Comments: Thick white discharge in vaginal vault. Mild suprapubic discomfort on manual exam.  Musculoskeletal: Normal range of motion. She exhibits no edema.  Neurological: She is alert.  Skin: Skin is warm and dry. No rash noted. She is not diaphoretic. No erythema. No pallor.  Psychiatric: She has a normal mood and affect.  Nursing note and vitals reviewed.    ED Treatments / Results  Labs (all labs ordered are listed, but only abnormal results are displayed) Labs Reviewed  WET PREP, GENITAL - Abnormal; Notable for the following:       Result Value   Clue Cells Wet Prep HPF POC PRESENT (*)     WBC, Wet Prep HPF POC MANY (*)    All other components within normal limits  URINALYSIS, ROUTINE W REFLEX MICROSCOPIC  PREGNANCY, URINE  RPR  HIV ANTIBODY (ROUTINE TESTING)  GC/CHLAMYDIA PROBE AMP ([place]) NOT AT [facility]    EKG  EKG Interpretation None       Radiology No results found.  Procedures Procedures (including critical care time)  Medications Ordered in ED Medications - No data to display   Initial Impression / Assessment and Plan / ED Course  I have reviewed the triage vital signs and the nursing notes.  Pertinent labs & imaging results that were available during my care of the patient were reviewed by me and considered in my medical decision making (see chart for details).    Patient presents with concerns for STD exposure. Recent unprotected intercourse. Vaginal pruritus, clear discharge and suprapubic discomfort for 2 days. Hx of hysterectomy  U/A negative WET prep with evidence of BV. Will treat with flagyl. Patient instructed to avoid alcohol with this medication and follow up with her PCP.  Discussed strict return precautions and advised to return to the emergency department if experiencing any new or worsening symptoms. Instructions were understood and patient agreed with discharge plan.  Final Clinical Impressions(s) / ED Diagnoses   Final diagnoses:  Possible exposure to STD  BV (bacterial vaginosis)    New Prescriptions New Prescriptions   METRONIDAZOLE (FLAGYL) 500 MG TABLET    Take 1 tablet (500 mg total) by mouth 2 (two) times daily.     Emeline General, PA-C 10/20/16 Sander Nephew, MD 10/20/16 [phone]

## 2016-10-21 LAB — GC/CHLAMYDIA PROBE AMP (~~LOC~~) NOT AT ARMC
Chlamydia: NEGATIVE
Neisseria Gonorrhea: NEGATIVE

## 2016-10-22 LAB — RPR: RPR Ser Ql: NONREACTIVE

## 2016-10-22 LAB — HIV ANTIBODY (ROUTINE TESTING W REFLEX): HIV SCREEN 4TH GENERATION: NONREACTIVE

## 2016-12-12 ENCOUNTER — Encounter (HOSPITAL_COMMUNITY): Payer: Self-pay

## 2016-12-12 ENCOUNTER — Emergency Department (HOSPITAL_COMMUNITY): Payer: Medicaid Other

## 2016-12-12 ENCOUNTER — Emergency Department (HOSPITAL_COMMUNITY)
Admission: EM | Admit: 2016-12-12 | Discharge: 2016-12-12 | Disposition: A | Discharge status: Home / Self Care | Payer: Medicaid Other | Attending: Emergency Medicine | Admitting: Emergency Medicine

## 2016-12-12 DIAGNOSIS — F17210 Nicotine dependence, cigarettes, uncomplicated: Secondary | ICD-10-CM | POA: Insufficient documentation

## 2016-12-12 DIAGNOSIS — Y929 Unspecified place or not applicable: Secondary | ICD-10-CM | POA: Diagnosis not present

## 2016-12-12 DIAGNOSIS — Y999 Unspecified external cause status: Secondary | ICD-10-CM | POA: Insufficient documentation

## 2016-12-12 DIAGNOSIS — X58XXXA Exposure to other specified factors, initial encounter: Secondary | ICD-10-CM | POA: Insufficient documentation

## 2016-12-12 DIAGNOSIS — M545 Low back pain: Secondary | ICD-10-CM | POA: Diagnosis present

## 2016-12-12 DIAGNOSIS — S29019A Strain of muscle and tendon of unspecified wall of thorax, initial encounter: Secondary | ICD-10-CM

## 2016-12-12 DIAGNOSIS — Y939 Activity, unspecified: Secondary | ICD-10-CM | POA: Diagnosis not present

## 2016-12-12 LAB — POC URINE PREG, ED: Preg Test, Ur: NEGATIVE

## 2016-12-12 MED ORDER — ONDANSETRON HCL 4 MG PO TABS
4.0000 mg | ORAL_TABLET | Freq: Once | ORAL | Status: AC
  Administered 2016-12-12: 4 mg via ORAL
  Filled 2016-12-12: qty 1

## 2016-12-12 MED ORDER — IBUPROFEN 800 MG PO TABS
800.0000 mg | ORAL_TABLET | Freq: Once | ORAL | Status: AC
  Administered 2016-12-12: 800 mg via ORAL
  Filled 2016-12-12: qty 1

## 2016-12-12 MED ORDER — CYCLOBENZAPRINE HCL 10 MG PO TABS
10.0000 mg | ORAL_TABLET | Freq: Once | ORAL | Status: AC
  Administered 2016-12-12: 10 mg via ORAL
  Filled 2016-12-12: qty 1

## 2016-12-12 MED ORDER — CYCLOBENZAPRINE HCL 10 MG PO TABS: ORAL_TABLET | ORAL | 0 refills | Status: DC

## 2016-12-12 MED ORDER — IBUPROFEN 600 MG PO TABS: 600.0000 mg | ORAL_TABLET | Freq: Four times a day (QID) | ORAL | 0 refills | Status: DC

## 2016-12-12 NOTE — ED Provider Notes (Signed)
[facility] EMERGENCY DEPARTMENT Provider Note   CSN: [id number] Arrival date & time: 12/12/16  1606     History   Chief Complaint Chief Complaint  Patient presents with  . Back Pain    HPI [name] [name] is a 36 y.o. female.   Back Pain   This is a new problem. The current episode started yesterday. The problem occurs constantly. The problem has been gradually worsening. The pain is associated with lifting heavy objects. The pain is present in the lumbar spine. The quality of the pain is described as shooting. The pain is moderate. Associated symptoms include numbness. Pertinent negatives include no chest pain, no fever, no abdominal pain, no bowel incontinence, no perianal numbness, no bladder incontinence and no dysuria. She has tried bed rest for the symptoms.    Past Medical History:  Diagnosis Date  . Anemia   . Blood transfusion without reported diagnosis   . Pregnant     Patient Active Problem List   Diagnosis Date Noted  . Late prenatal care complicating pregnancy in second trimester 09/30/2012  . Pregnant 09/30/2012  . Anemia 10/08/2011    Past Surgical History:  Procedure Laterality Date  . ABDOMINAL HYSTERECTOMY    . TUBAL LIGATION      OB History    Gravida Para Term Preterm AB Living   6 5 5     5    SAB TAB Ectopic Multiple Live Births           3       Home Medications    Prior to Admission medications   Not on File    Family History Family History  Problem Relation Age of Onset  . Diabetes Other     Social History Social History  Substance Use Topics  . Smoking status: Current Some Day Smoker    Packs/day: 0.50    Years: 5.00    Types: Cigarettes  . Smokeless tobacco: Never Used  . Alcohol use Yes     Comment: occasional     Allergies   Patient has no known allergies.   Review of Systems Review of Systems  Constitutional: Negative for activity change and fever.       All ROS Neg except as noted in HPI  HENT: Negative  for nosebleeds.   Eyes: Negative for photophobia and discharge.  Respiratory: Negative for cough, shortness of breath and wheezing.   Cardiovascular: Negative for chest pain and palpitations.  Gastrointestinal: Negative for abdominal pain, blood in stool and bowel incontinence.  Genitourinary: Negative for bladder incontinence, dysuria, frequency and hematuria.  Musculoskeletal: Positive for back pain. Negative for arthralgias and neck pain.  Skin: Negative.   Neurological: Positive for numbness. Negative for dizziness, seizures and speech difficulty.  Psychiatric/Behavioral: Negative for confusion and hallucinations.     Physical Exam Updated Vital Signs BP 123/78 (BP Location: Right Arm)   Pulse 90   Temp 97.9 F (36.6 C) (Oral)   Resp 15   Ht 5\' 4"  (1.626 m)   Wt 79.4 kg (175 lb)   LMP 06/06/2014 (Exact Date)   SpO2 99%   BMI 30.04 kg/m   Physical Exam  Constitutional: She is oriented to person, place, and time. She appears well-developed and well-nourished.  Non-toxic appearance.  HENT:  Head: Normocephalic.  Right Ear: Tympanic membrane and external ear normal.  Left Ear: Tympanic membrane and external ear normal.  Eyes: Pupils are equal, round, and reactive to light. EOM and lids are normal.  Neck: Normal range of motion. Neck supple. Carotid bruit is not present.  Cardiovascular: Normal rate, regular rhythm, normal heart sounds, intact distal pulses and normal pulses.   Pulmonary/Chest: Breath sounds normal. No respiratory distress.  Abdominal: Soft. Bowel sounds are normal. There is no tenderness. There is no guarding.  Musculoskeletal: Normal range of motion.       Thoracic back: She exhibits pain.       Back:  Lymphadenopathy:       Head (right side): No submandibular adenopathy present.       Head (left side): No submandibular adenopathy present.    She has no cervical adenopathy.  Neurological: She is alert and oriented to person, place, and time. She has  normal strength. No cranial nerve deficit or sensory deficit.  Skin: Skin is warm and dry.  Psychiatric: She has a normal mood and affect. Her speech is normal.  Nursing note and vitals reviewed.    ED Treatments / Results  Labs (all labs ordered are listed, but only abnormal results are displayed) Labs Reviewed - No data to display  EKG  EKG Interpretation None       Radiology No results found.  Procedures Procedures (including critical care time)  Medications Ordered in ED Medications - No data to display   Initial Impression / Assessment and Plan / ED Course  I have reviewed the triage vital signs and the nursing notes.  Pertinent labs & imaging results that were available during my care of the patient were reviewed by me and considered in my medical decision making (see chart for details).       Final Clinical Impressions(s) / ED Diagnoses MDM Patient reports she was bending over at work and now has pain of her back.  Patient reports pain mostly in the thoracic area. No cough, SOB,or lung related problem. X-ray of the thoracic spine is negative for any compression fracture, dislocation, or other complications.  There are no gross neurologic deficits appreciated on examination at this time.  The patient will be asked to use a heating pad to the area.  She is asked to rest her back as much as possible.  Prescription for Flexeril and ibuprofen given to the patient.  The patient will follow up with orthopedics if not improving.   Final diagnoses:  Strain of thoracic region, initial encounter    New Prescriptions New Prescriptions   No medications on file     Annette Stable 12/14/16 1913    Noemi Chapel, MD 12/14/16 2000

## 2016-12-12 NOTE — Discharge Instructions (Signed)
Your nerve and neurologic examination is normal.  The x-ray of your thoracic spine is normal.  I suspect that you have muscle strain involving the thoracic area.  Please use a heating pad to the area player.  Please rest her back as much as possible.  Use ibuprofen with breakfast, lunch, dinner, and at bedtime.  Please use Flexeril at bedtime for spasm pain, or every 6 hours for severe spasm pain.This medication may cause drowsiness. Please do not drink, drive, or participate in activity that requires concentration while taking this medication.

## 2016-12-12 NOTE — ED Triage Notes (Signed)
Reports of working yesterday, bent over and now having back pain.

## 2018-03-10 ENCOUNTER — Other Ambulatory Visit: Payer: Self-pay

## 2018-03-10 ENCOUNTER — Emergency Department (HOSPITAL_COMMUNITY)
Admission: EM | Admit: 2018-03-10 | Discharge: 2018-03-11 | Disposition: A | Discharge status: Home / Self Care | Payer: Medicaid Other | Attending: Emergency Medicine | Admitting: Emergency Medicine

## 2018-03-10 DIAGNOSIS — F17210 Nicotine dependence, cigarettes, uncomplicated: Secondary | ICD-10-CM | POA: Diagnosis not present

## 2018-03-10 DIAGNOSIS — J029 Acute pharyngitis, unspecified: Secondary | ICD-10-CM | POA: Insufficient documentation

## 2018-03-10 DIAGNOSIS — J020 Streptococcal pharyngitis: Secondary | ICD-10-CM

## 2018-03-10 LAB — CBC WITH DIFFERENTIAL/PLATELET
ABS IMMATURE GRANULOCYTES: 0.09 10*3/uL — AB (ref 0.00–0.07)
Basophils Absolute: 0 10*3/uL (ref 0.0–0.1)
Basophils Relative: 0 %
Eosinophils Absolute: 0.1 10*3/uL (ref 0.0–0.5)
Eosinophils Relative: 1 %
HCT: 38.4 % (ref 36.0–46.0)
Hemoglobin: 12.1 g/dL (ref 12.0–15.0)
Immature Granulocytes: 1 %
Lymphocytes Relative: 14 %
Lymphs Abs: 2.2 10*3/uL (ref 0.7–4.0)
MCH: 30 pg (ref 26.0–34.0)
MCHC: 31.5 g/dL (ref 30.0–36.0)
MCV: 95.3 fL (ref 80.0–100.0)
Monocytes Absolute: 1.1 10*3/uL — ABNORMAL HIGH (ref 0.1–1.0)
Monocytes Relative: 7 %
Neutro Abs: 11.6 10*3/uL — ABNORMAL HIGH (ref 1.7–7.7)
Neutrophils Relative %: 77 %
Platelets: 224 10*3/uL (ref 150–400)
RBC: 4.03 MIL/uL (ref 3.87–5.11)
RDW: 13.5 % (ref 11.5–15.5)
WBC: 15.2 10*3/uL — ABNORMAL HIGH (ref 4.0–10.5)
nRBC: 0 % (ref 0.0–0.2)

## 2018-03-10 LAB — MONONUCLEOSIS SCREEN: Mono Screen: NEGATIVE

## 2018-03-10 LAB — GROUP A STREP BY PCR: Group A Strep by PCR: DETECTED — AB

## 2018-03-10 MED ORDER — SODIUM CHLORIDE 0.9 % IV BOLUS
1000.0000 mL | Freq: Once | INTRAVENOUS | Status: AC
  Administered 2018-03-10: 1000 mL via INTRAVENOUS

## 2018-03-10 MED ORDER — DEXAMETHASONE SODIUM PHOSPHATE 10 MG/ML IJ SOLN
10.0000 mg | Freq: Once | INTRAMUSCULAR | Status: AC
  Administered 2018-03-10: 10 mg via INTRAMUSCULAR
  Filled 2018-03-10: qty 1

## 2018-03-10 MED ORDER — IBUPROFEN 200 MG PO TABS
600.0000 mg | ORAL_TABLET | Freq: Once | ORAL | Status: AC
  Administered 2018-03-10: 600 mg via ORAL
  Filled 2018-03-10: qty 3

## 2018-03-10 MED ORDER — LIDOCAINE VISCOUS HCL 2 % MT SOLN
15.0000 mL | Freq: Once | OROMUCOSAL | Status: AC
  Administered 2018-03-10: 15 mL via OROMUCOSAL
  Filled 2018-03-10: qty 15

## 2018-03-10 NOTE — ED Notes (Signed)
Spoke to lab about blood work, tubes will be processed now.

## 2018-03-10 NOTE — ED Provider Notes (Signed)
38 year old female received at signout from [facility] pending CT scan. Per her HPI:   "[name] [name] is a 38 y.o. female with history of anemia, who presents to the emergency department for evaluation of sore throat.  She reports she has had issues with sore throat over the past 3 months.  Symptoms initially started in November when she had 4 to 5 days of severe sore throat with no other associated symptoms, this seemed to improve but never completely resolved, she went to an urgent care where they did a strep throat swab which was negative.  She reports that a few weeks later sore throat seem to worsen again this time she had associated rhinorrhea, nasal congestion and cough and chalked it up to maybe being a virus or flulike illness, once again symptoms seem to somewhat resolved but sore throat never completely went away and over the past 2 to 3 days she has had a worsening of her sore throat again.  She reports this time it is worse than ever was previously.  She reports it is extremely painful to swallow and she feels like her voice sounds muffled.  She has had some chills but has not taken her temperature at home.  She also reports pain and swelling particularly over the right side of her neck.  She reports that she has been taking some Keflex that was given to her for a friend and thinks she was also on another antibiotic back in November but is unsure the name of this.  Reports she is also been using ibuprofen, Tylenol and Chloraseptic throat spray.  Denies any other aggravating or alleviating factors, no known sick contacts, patient also expresses concern that she could possibly have mono.  Also reports she was recently in a relationship, was sexually active and wonders if she could have gonorrhea in her throat."  Physical Exam  BP 108/64   Pulse (!) 103   Temp 98.7 F (37.1 C) (Oral)   Resp 16   Ht 5\' 4"  (1.626 m)   Wt 83.9 kg   LMP 06/06/2014 (Exact Date)   SpO2 98%   BMI 31.76 kg/m    Physical Exam   Speaks in complete, fluent sentences.  No drooling.  No trismus.  Tolerating fluids without difficulty.  ED Course/Procedures     Procedures  MDM   38 year old female with a history of anemia received at signout from [facility] pending CT soft tissue of the neck.  She has had a sore throat for the last 3 months that is worsened over the last few days.  Please see [facility] for further history, physical exam, medical decision making.  Mono screen is negative.  Strep PCR is positive; treated with PCN in the ED.  Potassium is 3.1 and was replenished in the ED.  She has a leukocytosis of 15 with an elevated neutrophil count of 11.6.  Of note, she also has an elevated monocyte count and increased immature granulocytes.  CT scan is consistent with pharyngitis/tonsillitis, but no peritonsillar abscess or epiglottitis.  She has been tolerating fluids in the ED.  She reports that she is feeling much better.  Given prolonged sore throat, will discharge with a referral to ENT and symptomatic treatment.  Strict return precautions given.  She is hemodynamically stable and in no acute distress.  She is safe for discharge to home with outpatient follow-up at this time.     [name] A, PA-C 03/11/18 0122    Ward,  Delice Bison, DO 03/11/18 0154

## 2018-03-10 NOTE — ED Triage Notes (Signed)
Pt reports she has a sore throat x3 months. Pt has had 2 rounds of antibiotics and is concerned about mono.  Pt has no fever today per EMS.

## 2018-03-10 NOTE — ED Provider Notes (Signed)
[facility] DEPT Provider Note   CSN: [id number] Arrival date & time: 03/10/18  1536     History   Chief Complaint Chief Complaint  Patient presents with  . Sore Throat    HPI [name] [name] is a 38 y.o. female.  [name] [name] is a 38 y.o. female with history of anemia, who presents to the emergency department for evaluation of sore throat.  She reports she has had issues with sore throat over the past 3 months.  Symptoms initially started in November when she had 4 to 5 days of severe sore throat with no other associated symptoms, this seemed to improve but never completely resolved, she went to an urgent care where they did a strep throat swab which was negative.  She reports that a few weeks later sore throat seem to worsen again this time she had associated rhinorrhea, nasal congestion and cough and chalked it up to maybe being a virus or flulike illness, once again symptoms seem to somewhat resolved but sore throat never completely went away and over the past 2 to 3 days she has had a worsening of her sore throat again.  She reports this time it is worse than ever was previously.  She reports it is extremely painful to swallow and she feels like her voice sounds muffled.  She has had some chills but has not taken her temperature at home.  She also reports pain and swelling particularly over the right side of her neck.  She reports that she has been taking some Keflex that was given to her for a friend and thinks she was also on another antibiotic back in November but is unsure the name of this.  Reports she is also been using ibuprofen, Tylenol and Chloraseptic throat spray.  Denies any other aggravating or alleviating factors, no known sick contacts, patient also expresses concern that she could possibly have mono.  Also reports she was recently in a relationship, was sexually active and wonders if she could have gonorrhea in her throat.     Past Medical  History:  Diagnosis Date  . Anemia   . Blood transfusion without reported diagnosis   . Pregnant     Patient Active Problem List   Diagnosis Date Noted  . Late prenatal care complicating pregnancy in second trimester 09/30/2012  . Pregnant 09/30/2012  . Anemia 10/08/2011    Past Surgical History:  Procedure Laterality Date  . ABDOMINAL HYSTERECTOMY    . TUBAL LIGATION       OB History    Gravida  6   Para  5   Term  5   Preterm      AB      Living  5     SAB      TAB      Ectopic      Multiple      Live Births  3            Home Medications    Prior to Admission medications   Medication Sig Start Date End Date Taking? Authorizing Provider  cephALEXin (KEFLEX) 500 MG capsule Take 1,000 mg by mouth once.   Yes [provider]  phenol (CHLORASEPTIC) 1.4 % LIQD Use as directed 1 spray in the mouth or throat as needed for throat irritation / pain.   Yes [provider]  cyclobenzaprine (FLEXERIL) 10 MG tablet 1 at bedtime, or every 6 hours if needed for severe spasm  pain. Patient not taking: Reported on 03/10/2018 12/12/16   Lily Kocher, PA-C  ibuprofen (ADVIL,MOTRIN) 600 MG tablet Take 1 tablet (600 mg total) by mouth 4 (four) times daily. Patient not taking: Reported on 03/10/2018 12/12/16   Lily Kocher, PA-C    Family History Family History  Problem Relation Age of Onset  . Diabetes Other     Social History Social History   Tobacco Use  . Smoking status: Current Some Day Smoker    Packs/day: 0.50    Years: 5.00    Pack years: 2.50    Types: Cigarettes  . Smokeless tobacco: Never Used  Substance Use Topics  . Alcohol use: Yes    Comment: occasional  . Drug use: Yes    Types: Cocaine    Comment: 3 days ago     Allergies   Patient has no known allergies.   Review of Systems Review of Systems  Constitutional: Positive for chills. Negative for fever.  HENT: Positive for sore throat, trouble swallowing and voice  change. Negative for congestion, ear discharge, ear pain, postnasal drip, rhinorrhea and sinus pain.   Respiratory: Negative for cough and shortness of breath.   Cardiovascular: Negative for chest pain.  Gastrointestinal: Negative for abdominal pain, nausea and vomiting.  Musculoskeletal: Positive for neck pain. Negative for neck stiffness.  Skin: Negative for color change and rash.  Neurological: Negative for dizziness, syncope, light-headedness and headaches.  All other systems reviewed and are negative.    Physical Exam Updated Vital Signs BP (!) 126/99 (BP Location: Right Arm)   Pulse (!) 102   Temp 98.7 F (37.1 C) (Oral)   Resp 18   Ht 5\' 4"  (1.626 m)   Wt 83.9 kg   LMP 06/06/2014 (Exact Date)   SpO2 100%   BMI 31.76 kg/m   Physical Exam Vitals signs and nursing note reviewed.  Constitutional:      General: She is not in acute distress.    Appearance: She is well-developed and normal weight. She is not ill-appearing, toxic-appearing or diaphoretic.  HENT:     Head: Normocephalic and atraumatic.     Right Ear: Tympanic membrane and ear canal normal.     Left Ear: Tympanic membrane and ear canal normal.     Nose: No congestion or rhinorrhea.     Mouth/Throat:     Mouth: Mucous membranes are moist.     Pharynx: Uvula midline. Pharyngeal swelling and posterior oropharyngeal erythema present. No oropharyngeal exudate or uvula swelling.     Tonsils: No tonsillar exudate or tonsillar abscesses. Swelling: 4+ on the right. 4+ on the left.     Comments: Mucous membranes moist, posterior oropharynx or erythematous with no exudates but 4+ bilateral tonsillar edema, uvula midline, patient does have muffled hot potato voice but does not have trismus and is tolerating secretions without difficulty Neck:     Musculoskeletal: Neck supple.     Comments: No stridor on auscultation.  Patient does have bilateral cervical lymphadenopathy but this is more pronounced over the right and tender  to palpation.  No nuchal rigidity. Cardiovascular:     Rate and Rhythm: Regular rhythm. Tachycardia present.     Heart sounds: Normal heart sounds. No murmur. No friction rub. No gallop.   Pulmonary:     Effort: Pulmonary effort is normal. No respiratory distress.     Breath sounds: Normal breath sounds. No stridor. No wheezing, rhonchi or rales.     Comments: Respirations equal and unlabored, patient able  to speak in full sentences, lungs clear to auscultation bilaterally Abdominal:     General: Bowel sounds are normal.     Palpations: Abdomen is soft. There is no mass.     Tenderness: There is no abdominal tenderness. There is no guarding or rebound.     Comments: Abdomen soft, nondistended, nontender to palpation in all quadrants without guarding or peritoneal signs  Lymphadenopathy:     Cervical: Cervical adenopathy present.  Skin:    General: Skin is warm and dry.  Neurological:     Mental Status: She is alert.  Psychiatric:        Mood and Affect: Mood normal.        Behavior: Behavior normal.      ED Treatments / Results  Labs (all labs ordered are listed, but only abnormal results are displayed) Labs Reviewed  GROUP A STREP BY PCR - Abnormal; Notable for the following components:      Result Value   Group A Strep by PCR DETECTED (*)    All other components within normal limits  BASIC METABOLIC PANEL - Abnormal; Notable for the following components:   Potassium 3.1 (*)    BUN <5 (*)    All other components within normal limits  CBC WITH DIFFERENTIAL/PLATELET - Abnormal; Notable for the following components:   WBC 15.2 (*)    Neutro Abs 11.6 (*)    Monocytes Absolute 1.1 (*)    Abs Immature Granulocytes 0.09 (*)    All other components within normal limits  MONONUCLEOSIS SCREEN  GC/CHLAMYDIA PROBE AMP ([place]) NOT AT [facility]    EKG None  Radiology No results found.  Procedures Procedures (including critical care time)  Medications Ordered in  ED Medications  dexamethasone (DECADRON) injection 10 mg (10 mg Intramuscular Given 03/10/18 2107)  ibuprofen (ADVIL,MOTRIN) tablet 600 mg (600 mg Oral Given 03/10/18 2106)  lidocaine (XYLOCAINE) 2 % viscous mouth solution 15 mL (15 mLs Mouth/Throat Given 03/10/18 2106)  sodium chloride 0.9 % bolus 1,000 mL (1,000 mLs Intravenous New Bag/Given (Non-Interop) 03/10/18 2225)     Initial Impression / Assessment and Plan / ED Course  I have reviewed the triage vital signs and the nursing notes.  Pertinent labs & imaging results that were available during my care of the patient were reviewed by me and considered in my medical decision making (see chart for details).  Patient presents to the emergency department for evaluation of sore throat, the symptoms have been occurring intermittently over the past 3 months, she has had negative strep test previously but reports it has returned and is worse than previous.  Patient mildly tachycardic on arrival but all other vitals normal she is afebrile.  Patient does have muffled hot potato voice, but does not have trismus.  Bilateral tonsils with 4+ edema and erythema but no exudates, uvula midline, patient does have noted lymphadenopathy which is worse on the right than the left given the patient's pain is worse on the right with increased lymphadenopathy in this side there is some concern for possible deep space abscess.  We will get basic labs and CT soft tissue neck.  Given that patient is also had persistent sore throat over the past several months we will also check Monospot and collect gonorrhea and chlamydia swab given recent sexual contact.  IV fluids, Decadron, Motrin and viscous lidocaine for symptomatic management.  Rapid strep PCR is positive.  Monospot negative.  GC/chlamydia culture pending.  Patient does have a leukocytosis of  15.2, normal hemoglobin.  Mild hypokalemia of 3.1 but no other acute electrolyte derangements, normal renal function.  CT soft  tissue neck is pending.  At shift change care signed out to [place] who will follow-up on CT scan and disposition appropriately.  If CT is normal patient can be treated with IM penicillin, I have discussed appropriate symptomatic treatment and return precautions with patient.  If CT shows any deep space abscess patient will need ENT consultation.  Final Clinical Impressions(s) / ED Diagnoses   Final diagnoses:  Sore throat    ED Discharge Orders    None       Janet Berlin 03/11/18 0019    Lacretia Leigh, MD 03/11/18 2237

## 2018-03-11 ENCOUNTER — Emergency Department (HOSPITAL_COMMUNITY): Payer: Medicaid Other

## 2018-03-11 LAB — BASIC METABOLIC PANEL
Anion gap: 10 (ref 5–15)
CO2: 24 mmol/L (ref 22–32)
Calcium: 9.6 mg/dL (ref 8.9–10.3)
Chloride: 102 mmol/L (ref 98–111)
Creatinine, Ser: 0.68 mg/dL (ref 0.44–1.00)
GFR calc Af Amer: >60 mL/min (ref >60)
GFR calc non Af Amer: >60 mL/min (ref >60)
Glucose, Bld: 94 mg/dL (ref 70–99)
Potassium: 3.1 mmol/L — ABNORMAL LOW (ref 3.5–5.1)
Sodium: 136 mmol/L (ref 135–145)

## 2018-03-11 LAB — GC/CHLAMYDIA PROBE AMP (~~LOC~~) NOT AT ARMC
Chlamydia: NEGATIVE
Neisseria Gonorrhea: NEGATIVE

## 2018-03-11 MED ORDER — PENICILLIN G BENZATHINE 1200000 UNIT/2ML IM SUSP
1.2000 10*6.[IU] | Freq: Once | INTRAMUSCULAR | Status: AC
  Administered 2018-03-11: 1.2 10*6.[IU] via INTRAMUSCULAR
  Filled 2018-03-11: qty 2

## 2018-03-11 MED ORDER — IBUPROFEN 600 MG PO TABS: 600.0000 mg | ORAL_TABLET | Freq: Four times a day (QID) | ORAL | 0 refills | Status: DC | PRN

## 2018-03-11 MED ORDER — IOHEXOL 300 MG/ML  SOLN
75.0000 mL | Freq: Once | INTRAMUSCULAR | Status: AC | PRN
  Administered 2018-03-11: 75 mL via INTRAVENOUS

## 2018-03-11 MED ORDER — SODIUM CHLORIDE (PF) 0.9 % IJ SOLN
INTRAMUSCULAR | Status: AC
  Filled 2018-03-11: qty 50

## 2018-03-11 MED ORDER — LIDOCAINE VISCOUS HCL 2 % MT SOLN: 15.0000 mL | OROMUCOSAL | 0 refills | Status: DC | PRN

## 2018-03-11 MED ORDER — POTASSIUM CHLORIDE CRYS ER 20 MEQ PO TBCR
40.0000 meq | EXTENDED_RELEASE_TABLET | Freq: Once | ORAL | Status: AC
  Administered 2018-03-11: 40 meq via ORAL
  Filled 2018-03-11: qty 2

## 2018-03-11 NOTE — Discharge Instructions (Addendum)
Thank you for allowing me to care for you today in the Emergency Department.   You tested positive for strep throat today.  You have been treated with penicillin in the ER.  At home, you can take 600 mg of ibuprofen with food or 650 mg of Tylenol every 6 hours for pain or alternate between these 2 medications every 3 hours.  You can swallow 15 mL of viscous lidocaine every 3 hours as needed for pain or sore throat.  Continue to drink plenty of fluids so that you do not get dehydrated.  Call to schedule a follow-up appointment with ear nose and throat regarding your prolonged sore throat and other symptoms.  Return to the emergency department if you develop severe shortness of breath, if you feel as if your throat is closing, if you develop drooling because you are unable to swallow your own saliva, if you become unable to open your mouth, develop significant swelling to one side of your face or neck, or other new, concerning symptoms.

## 2018-09-23 ENCOUNTER — Other Ambulatory Visit: Payer: Self-pay

## 2018-09-23 ENCOUNTER — Encounter (HOSPITAL_COMMUNITY): Payer: Self-pay

## 2018-09-23 ENCOUNTER — Emergency Department (HOSPITAL_COMMUNITY)
Admission: EM | Admit: 2018-09-23 | Discharge: 2018-09-23 | Discharge status: Against Medical Advice | Payer: Medicaid Other | Attending: Emergency Medicine | Admitting: Emergency Medicine

## 2018-09-23 DIAGNOSIS — Z5321 Procedure and treatment not carried out due to patient leaving prior to being seen by health care provider: Secondary | ICD-10-CM | POA: Insufficient documentation

## 2018-09-23 DIAGNOSIS — M549 Dorsalgia, unspecified: Secondary | ICD-10-CM | POA: Diagnosis present

## 2018-09-23 NOTE — ED Notes (Signed)
Pt wants to leave. Pt encouraged to stay and see a provider. Pt made aware she is leaving AMA. Pt refuses to stay. Pt seen walking out of lobby.

## 2018-09-23 NOTE — ED Triage Notes (Signed)
Pt arrives POV for eval of mid upper back pain x 1 year, worse this week. Pt reports no known injury or trauma. Pt also reports that she "thinks she has some GERD". Pt reports that when she drinks ETOH "it feels stuck in her throat". Denies GERD sx at this time

## 2018-09-25 ENCOUNTER — Other Ambulatory Visit: Payer: Self-pay

## 2018-09-25 ENCOUNTER — Encounter (HOSPITAL_COMMUNITY): Payer: Self-pay

## 2018-09-25 ENCOUNTER — Ambulatory Visit (HOSPITAL_COMMUNITY)
Admission: EM | Admit: 2018-09-25 | Discharge: 2018-09-25 | Disposition: A | Discharge status: Home / Self Care | Payer: Medicaid Other | Attending: Family Medicine | Admitting: Family Medicine

## 2018-09-25 DIAGNOSIS — K219 Gastro-esophageal reflux disease without esophagitis: Secondary | ICD-10-CM | POA: Diagnosis not present

## 2018-09-25 MED ORDER — LIDOCAINE VISCOUS HCL 2 % MT SOLN
15.0000 mL | Freq: Once | OROMUCOSAL | Status: AC
  Administered 2018-09-25: 15 mL via ORAL

## 2018-09-25 MED ORDER — ALUM & MAG HYDROXIDE-SIMETH 200-200-20 MG/5ML PO SUSP
ORAL | Status: AC
  Filled 2018-09-25: qty 30

## 2018-09-25 MED ORDER — PANTOPRAZOLE SODIUM 20 MG PO TBEC: 20.0000 mg | DELAYED_RELEASE_TABLET | Freq: Every day | ORAL | 1 refills | Status: DC

## 2018-09-25 MED ORDER — ALUM & MAG HYDROXIDE-SIMETH 200-200-20 MG/5ML PO SUSP
30.0000 mL | Freq: Once | ORAL | Status: AC
  Administered 2018-09-25: 30 mL via ORAL

## 2018-09-25 MED ORDER — LIDOCAINE VISCOUS HCL 2 % MT SOLN
OROMUCOSAL | Status: AC
  Filled 2018-09-25: qty 15

## 2018-09-25 NOTE — ED Triage Notes (Signed)
Pt presents with complaints of back pain that is chronic. She has been seen by a doctor before and cannot recall diagnosis. States her acid reflux has been worse. Back pain and acid reflux/indigestion has been worse over the last 4 days.

## 2018-09-25 NOTE — Discharge Instructions (Signed)
I believe your symptoms are associated with acid reflux.  We are going to try pantoprazole 20 mg once daily Make sure that you take the pantoprazole  30- 60 minutes prior to a meal with a glass of water.  Avoid spicy, greasy foods, caffeine, chocolate and milk products.  No eating 2-3 hours before bedtime. Elevate the head of the bed 30 degrees.  Try this for a few weeks to see if this improves your symptoms.  If you don't see any improvement or your symptoms worsen please follow up with a GI  Take Tylenol for your pain.  Avoid ibuprofen, Aleve

## 2018-09-26 NOTE — ED Provider Notes (Signed)
[facility]    CSN: [id number] Arrival date & time: 09/25/18  1403      History   Chief Complaint Chief Complaint  Patient presents with  . Back Pain    HPI [name] [name] is a 38 y.o. female.   Patient is a 38 year old female presents today with chronic acid reflux.  Reports that this has worsened.  Reports she has been using multiple over-the-counter remedies without much relief.  Reporting epigastric discomfort that goes into the back area.  Describes the discomfort as burning.  Sometimes it makes her vomit.  She does admit to eating a lot of spicy, greasy foods.  She associates the upper back pain to the heaviness of her breast.  Denies any back injuries, shortness of breath.  Denies any cough, chest congestion, fevers.  Denies any abdominal pain or diarrhea.  ROS per HPI      Past Medical History:  Diagnosis Date  . Anemia   . Blood transfusion without reported diagnosis   . Pregnant     Patient Active Problem List   Diagnosis Date Noted  . Late prenatal care complicating pregnancy in second trimester 09/30/2012  . Pregnant 09/30/2012  . Anemia 10/08/2011    Past Surgical History:  Procedure Laterality Date  . ABDOMINAL HYSTERECTOMY    . TUBAL LIGATION      OB History    Gravida  6   Para  5   Term  5   Preterm      AB      Living  5     SAB      TAB      Ectopic      Multiple      Live Births  3            Home Medications    Prior to Admission medications   Medication Sig Start Date End Date Taking? Authorizing Provider  ibuprofen (ADVIL,MOTRIN) 600 MG tablet Take 1 tablet (600 mg total) by mouth every 6 (six) hours as needed. 03/11/18   McDonald, Mia A, PA-C  lidocaine (XYLOCAINE) 2 % solution Use as directed 15 mLs in the mouth or throat every 3 (three) hours as needed for mouth pain. 03/11/18   McDonald, Mia A, PA-C  pantoprazole (PROTONIX) 20 MG tablet Take 1 tablet (20 mg total) by mouth daily. 09/25/18   Jaicion Laurie,  Tressia Miners A, NP  phenol (CHLORASEPTIC) 1.4 % LIQD Use as directed 1 spray in the mouth or throat as needed for throat irritation / pain.    [provider]    Family History Family History  Problem Relation Age of Onset  . Healthy Mother   . Diabetes Other   . Healthy Father     Social History Social History   Tobacco Use  . Smoking status: Current Some Day Smoker    Packs/day: 0.50    Years: 5.00    Pack years: 2.50    Types: Cigarettes  . Smokeless tobacco: Never Used  Substance Use Topics  . Alcohol use: Yes    Comment: occasional  . Drug use: Yes    Types: Cocaine    Comment: 3 days ago     Allergies   Patient has no known allergies.   Review of Systems Review of Systems   Physical Exam Triage Vital Signs ED Triage Vitals [09/25/18 1453]  Enc Vitals Group     BP 123/89     Pulse Rate 85  Resp 18     Temp 99.2 F (37.3 C)     Temp src      SpO2 100 %     Weight      Height      Head Circumference      Peak Flow      Pain Score 10     Pain Loc      Pain Edu?      Excl. in [place]?    No data found.  Updated Vital Signs BP 123/89   Pulse 85   Temp 99.2 F (37.3 C)   Resp 18   LMP 06/06/2014 (Exact Date)   SpO2 100%   Visual Acuity Right Eye Distance:   Left Eye Distance:   Bilateral Distance:    Right Eye Near:   Left Eye Near:    Bilateral Near:     Physical Exam Vitals signs and nursing note reviewed.  Constitutional:      General: She is not in acute distress.    Appearance: Normal appearance. She is not ill-appearing, toxic-appearing or diaphoretic.  HENT:     Head: Normocephalic and atraumatic.     Nose: Nose normal.  Eyes:     Conjunctiva/sclera: Conjunctivae normal.  Neck:     Musculoskeletal: Normal range of motion.  Pulmonary:     Effort: Pulmonary effort is normal.  Abdominal:     General: Bowel sounds are normal. There is no distension.     Palpations: Abdomen is soft. There is no mass.     Tenderness:  There is abdominal tenderness. There is no guarding or rebound.     Hernia: No hernia is present.  Musculoskeletal: Normal range of motion.  Skin:    General: Skin is warm and dry.  Neurological:     Mental Status: She is alert.  Psychiatric:        Mood and Affect: Mood normal.      UC Treatments / Results  Labs (all labs ordered are listed, but only abnormal results are displayed) Labs Reviewed - No data to display  EKG   Radiology No results found.  Procedures Procedures (including critical care time)  Medications Ordered in UC Medications  alum & mag hydroxide-simeth (MAALOX/MYLANTA) 200-200-20 MG/5ML suspension 30 mL (30 mLs Oral Given 09/25/18 1544)    And  lidocaine (XYLOCAINE) 2 % viscous mouth solution 15 mL (15 mLs Oral Given 09/25/18 1544)  alum & mag hydroxide-simeth (MAALOX/MYLANTA) 200-200-20 MG/5ML suspension (has no administration in time range)  lidocaine (XYLOCAINE) 2 % viscous mouth solution (has no administration in time range)    Initial Impression / Assessment and Plan / UC Course  I have reviewed the triage vital signs and the nursing notes.  Pertinent labs & imaging results that were available during my care of the patient were reviewed by me and considered in my medical decision making (see chart for details).     Symptoms and exam consistent with GERD Treated with GI cocktail here in the clinic  Pt had some relief. EKG with normal sinus rhythm and normal rate.  No concern for ACS based on epigastric pain and upper back pain . Will have pt start Protonix daily.  Instructions on diet and lifestyle modifications given Recommended follow up with GI for persistent symptoms.    Final Clinical Impressions(s) / UC Diagnoses   Final diagnoses:  Gastroesophageal reflux disease without esophagitis     Discharge Instructions     I believe your symptoms are  associated with acid reflux.  We are going to try pantoprazole 20 mg once daily Make  sure that you take the pantoprazole  30- 60 minutes prior to a meal with a glass of water.  Avoid spicy, greasy foods, caffeine, chocolate and milk products.  No eating 2-3 hours before bedtime. Elevate the head of the bed 30 degrees.  Try this for a few weeks to see if this improves your symptoms.  If you don't see any improvement or your symptoms worsen please follow up with a GI  Take Tylenol for your pain.  Avoid ibuprofen, Aleve    ED Prescriptions    Medication Sig Dispense Auth. Provider   pantoprazole (PROTONIX) 20 MG tablet Take 1 tablet (20 mg total) by mouth daily. 30 tablet Orvan July, NP     Controlled Substance Prescriptions [place] Controlled Substance Registry consulted? no   Orvan July, NP 09/28/18 1218

## 2018-10-13 ENCOUNTER — Ambulatory Visit: Payer: Medicaid Other | Admitting: Physician Assistant

## 2019-06-13 ENCOUNTER — Inpatient Hospital Stay (HOSPITAL_COMMUNITY): Payer: Medicaid Other | Admitting: Registered Nurse

## 2019-06-13 ENCOUNTER — Other Ambulatory Visit: Payer: Self-pay

## 2019-06-13 ENCOUNTER — Inpatient Hospital Stay (HOSPITAL_COMMUNITY)
Admission: EM | Admit: 2019-06-13 | Discharge: 2019-06-15 | DRG: 358 | Disposition: A | Discharge status: Home / Self Care | Payer: Medicaid Other | Attending: General Surgery | Admitting: General Surgery

## 2019-06-13 ENCOUNTER — Encounter (HOSPITAL_COMMUNITY): Admission: EM | Disposition: A | Discharge status: Home / Self Care | Payer: Self-pay | Source: Home / Self Care

## 2019-06-13 ENCOUNTER — Emergency Department (HOSPITAL_COMMUNITY): Payer: Medicaid Other

## 2019-06-13 ENCOUNTER — Encounter (HOSPITAL_COMMUNITY): Payer: Self-pay

## 2019-06-13 DIAGNOSIS — L0291 Cutaneous abscess, unspecified: Secondary | ICD-10-CM

## 2019-06-13 DIAGNOSIS — F17210 Nicotine dependence, cigarettes, uncomplicated: Secondary | ICD-10-CM | POA: Diagnosis present

## 2019-06-13 DIAGNOSIS — K641 Second degree hemorrhoids: Secondary | ICD-10-CM | POA: Diagnosis present

## 2019-06-13 DIAGNOSIS — K644 Residual hemorrhoidal skin tags: Secondary | ICD-10-CM | POA: Diagnosis present

## 2019-06-13 DIAGNOSIS — Z20822 Contact with and (suspected) exposure to covid-19: Secondary | ICD-10-CM | POA: Diagnosis present

## 2019-06-13 DIAGNOSIS — K6139 Other ischiorectal abscess: Principal | ICD-10-CM | POA: Diagnosis present

## 2019-06-13 DIAGNOSIS — Z720 Tobacco use: Secondary | ICD-10-CM

## 2019-06-13 HISTORY — PX: INCISION AND DRAINAGE ABSCESS: SHX5864

## 2019-06-13 LAB — CBC WITH DIFFERENTIAL/PLATELET
Abs Immature Granulocytes: 0 10*3/uL (ref 0.00–0.07)
Band Neutrophils: 3 %
Basophils Absolute: 0 10*3/uL (ref 0.0–0.1)
Basophils Relative: 0 %
Eosinophils Absolute: 0 10*3/uL (ref 0.0–0.5)
Eosinophils Relative: 0 %
HCT: 34.9 % — ABNORMAL LOW (ref 36.0–46.0)
Hemoglobin: 11.4 g/dL — ABNORMAL LOW (ref 12.0–15.0)
Lymphocytes Relative: 3 %
Lymphs Abs: 0.8 10*3/uL (ref 0.7–4.0)
MCH: 30.2 pg (ref 26.0–34.0)
MCHC: 32.7 g/dL (ref 30.0–36.0)
MCV: 92.6 fL (ref 80.0–100.0)
Monocytes Absolute: 1.4 10*3/uL — ABNORMAL HIGH (ref 0.1–1.0)
Monocytes Relative: 5 %
Neutro Abs: 25.3 10*3/uL — ABNORMAL HIGH (ref 1.7–7.7)
Neutrophils Relative %: 89 %
Platelets: 309 10*3/uL (ref 150–400)
RBC: 3.77 MIL/uL — ABNORMAL LOW (ref 3.87–5.11)
RDW: 13.4 % (ref 11.5–15.5)
WBC: 27.5 10*3/uL — ABNORMAL HIGH (ref 4.0–10.5)
nRBC: 0 % (ref 0.0–0.2)

## 2019-06-13 LAB — BASIC METABOLIC PANEL
Anion gap: 12 (ref 5–15)
BUN: 8 mg/dL (ref 6–20)
CO2: 22 mmol/L (ref 22–32)
Calcium: 9.3 mg/dL (ref 8.9–10.3)
Chloride: 104 mmol/L (ref 98–111)
Creatinine, Ser: 0.67 mg/dL (ref 0.44–1.00)
GFR calc Af Amer: >60 mL/min (ref >60)
GFR calc non Af Amer: >60 mL/min (ref >60)
Glucose, Bld: 120 mg/dL — ABNORMAL HIGH (ref 70–99)
Potassium: 3.2 mmol/L — ABNORMAL LOW (ref 3.5–5.1)
Sodium: 138 mmol/L (ref 135–145)

## 2019-06-13 LAB — LACTIC ACID, PLASMA: Lactic Acid, Venous: 1.2 mmol/L (ref 0.5–1.9)

## 2019-06-13 LAB — RESPIRATORY PANEL BY RT PCR (FLU A&B, COVID)
Influenza A by PCR: NEGATIVE
Influenza B by PCR: NEGATIVE
SARS Coronavirus 2 by RT PCR: NEGATIVE

## 2019-06-13 SURGERY — INCISION AND DRAINAGE, ABSCESS
Anesthesia: General | Site: Rectum

## 2019-06-13 MED ORDER — LACTATED RINGERS IV SOLN: INTRAVENOUS | Status: DC

## 2019-06-13 MED ORDER — OXYCODONE HCL 5 MG PO TABS
5.0000 mg | ORAL_TABLET | ORAL | Status: DC | PRN
  Administered 2019-06-14 – 2019-06-15 (×2): 10 mg via ORAL
  Filled 2019-06-13 (×2): qty 2

## 2019-06-13 MED ORDER — ONDANSETRON HCL 4 MG/2ML IJ SOLN
INTRAMUSCULAR | Status: AC
  Filled 2019-06-13: qty 2

## 2019-06-13 MED ORDER — LIP MEDEX EX OINT
1.0000 "application " | TOPICAL_OINTMENT | Freq: Two times a day (BID) | CUTANEOUS | Status: DC
  Administered 2019-06-13 – 2019-06-15 (×4): 1 via TOPICAL
  Filled 2019-06-13: qty 7

## 2019-06-13 MED ORDER — ACETAMINOPHEN 500 MG PO TABS
ORAL_TABLET | ORAL | Status: AC
  Filled 2019-06-13: qty 2

## 2019-06-13 MED ORDER — 0.9 % SODIUM CHLORIDE (POUR BTL) OPTIME
TOPICAL | Status: DC | PRN
  Administered 2019-06-13: 1000 mL

## 2019-06-13 MED ORDER — PROPOFOL 10 MG/ML IV BOLUS
INTRAVENOUS | Status: AC
  Filled 2019-06-13: qty 40

## 2019-06-13 MED ORDER — CELECOXIB 200 MG PO CAPS
400.0000 mg | ORAL_CAPSULE | ORAL | Status: AC
  Administered 2019-06-13: 400 mg via ORAL

## 2019-06-13 MED ORDER — BUPIVACAINE-EPINEPHRINE 0.5% -1:200000 IJ SOLN
INTRAMUSCULAR | Status: AC
  Filled 2019-06-13: qty 1

## 2019-06-13 MED ORDER — SODIUM CHLORIDE 0.9 % IV SOLN: Freq: Three times a day (TID) | INTRAVENOUS | Status: DC | PRN

## 2019-06-13 MED ORDER — DIBUCAINE (PERIANAL) 1 % EX OINT
TOPICAL_OINTMENT | CUTANEOUS | Status: AC
  Filled 2019-06-13: qty 28

## 2019-06-13 MED ORDER — DIPHENHYDRAMINE HCL 50 MG/ML IJ SOLN: 12.5000 mg | Freq: Four times a day (QID) | INTRAMUSCULAR | Status: DC | PRN

## 2019-06-13 MED ORDER — NICOTINE 14 MG/24HR TD PT24
14.0000 mg | MEDICATED_PATCH | Freq: Every day | TRANSDERMAL | Status: DC
  Administered 2019-06-14: 14 mg via TRANSDERMAL
  Filled 2019-06-13 (×2): qty 1

## 2019-06-13 MED ORDER — OXYCODONE HCL 5 MG PO TABS: 5.0000 mg | ORAL_TABLET | Freq: Once | ORAL | Status: DC | PRN

## 2019-06-13 MED ORDER — ENOXAPARIN SODIUM 40 MG/0.4ML ~~LOC~~ SOLN
40.0000 mg | SUBCUTANEOUS | Status: DC
  Administered 2019-06-14 – 2019-06-15 (×2): 40 mg via SUBCUTANEOUS
  Filled 2019-06-13 (×2): qty 0.4

## 2019-06-13 MED ORDER — LACTATED RINGERS IV SOLN: INTRAVENOUS | Status: DC | PRN

## 2019-06-13 MED ORDER — CHLORHEXIDINE GLUCONATE CLOTH 2 % EX PADS: 6.0000 | MEDICATED_PAD | Freq: Once | CUTANEOUS | Status: DC

## 2019-06-13 MED ORDER — IOHEXOL 300 MG/ML  SOLN
100.0000 mL | Freq: Once | INTRAMUSCULAR | Status: AC | PRN
  Administered 2019-06-13: 100 mL via INTRAVENOUS

## 2019-06-13 MED ORDER — METOPROLOL TARTRATE 5 MG/5ML IV SOLN: 5.0000 mg | Freq: Four times a day (QID) | INTRAVENOUS | Status: DC | PRN

## 2019-06-13 MED ORDER — CEFAZOLIN SODIUM-DEXTROSE 2-4 GM/100ML-% IV SOLN
2.0000 g | INTRAVENOUS | Status: AC
  Administered 2019-06-13: 2 g via INTRAVENOUS

## 2019-06-13 MED ORDER — OXYCODONE HCL 5 MG/5ML PO SOLN
5.0000 mg | Freq: Once | ORAL | Status: DC | PRN
Start: 2019-06-13 — End: 2019-06-13

## 2019-06-13 MED ORDER — BUPIVACAINE LIPOSOME 1.3 % IJ SUSP
20.0000 mL | Freq: Once | INTRAMUSCULAR | Status: AC
  Administered 2019-06-13: 20 mL
  Filled 2019-06-13: qty 20

## 2019-06-13 MED ORDER — ACETAMINOPHEN 500 MG PO TABS
1000.0000 mg | ORAL_TABLET | ORAL | Status: AC
  Administered 2019-06-13: 1000 mg via ORAL

## 2019-06-13 MED ORDER — ONDANSETRON HCL 4 MG/2ML IJ SOLN
INTRAMUSCULAR | Status: AC
  Filled 2019-06-13: qty 4

## 2019-06-13 MED ORDER — SODIUM CHLORIDE 0.9 % IV SOLN: 250.0000 mL | INTRAVENOUS | Status: DC | PRN

## 2019-06-13 MED ORDER — MEPERIDINE HCL 50 MG/ML IJ SOLN: 6.2500 mg | INTRAMUSCULAR | Status: DC | PRN

## 2019-06-13 MED ORDER — IBUPROFEN 200 MG PO TABS
400.0000 mg | ORAL_TABLET | Freq: Four times a day (QID) | ORAL | Status: DC | PRN
  Administered 2019-06-14 – 2019-06-15 (×3): 800 mg via ORAL
  Filled 2019-06-13 (×3): qty 4

## 2019-06-13 MED ORDER — PANTOPRAZOLE SODIUM 20 MG PO TBEC
20.0000 mg | DELAYED_RELEASE_TABLET | Freq: Every day | ORAL | Status: DC
  Administered 2019-06-14 – 2019-06-15 (×2): 20 mg via ORAL
  Filled 2019-06-13 (×2): qty 1

## 2019-06-13 MED ORDER — MORPHINE SULFATE (PF) 4 MG/ML IV SOLN
4.0000 mg | Freq: Once | INTRAVENOUS | Status: DC
  Filled 2019-06-13: qty 1

## 2019-06-13 MED ORDER — METRONIDAZOLE IN NACL 5-0.79 MG/ML-% IV SOLN: 500.0000 mg | INTRAVENOUS | Status: DC

## 2019-06-13 MED ORDER — SODIUM CHLORIDE 0.9% FLUSH: 3.0000 mL | INTRAVENOUS | Status: DC | PRN

## 2019-06-13 MED ORDER — HYDROMORPHONE HCL 1 MG/ML IJ SOLN: 0.2500 mg | INTRAMUSCULAR | Status: DC | PRN

## 2019-06-13 MED ORDER — PHENYLEPHRINE 40 MCG/ML (10ML) SYRINGE FOR IV PUSH (FOR BLOOD PRESSURE SUPPORT)
PREFILLED_SYRINGE | INTRAVENOUS | Status: DC | PRN
  Administered 2019-06-13: 80 ug via INTRAVENOUS

## 2019-06-13 MED ORDER — LACTATED RINGERS IV BOLUS: 1000.0000 mL | Freq: Three times a day (TID) | INTRAVENOUS | Status: DC | PRN

## 2019-06-13 MED ORDER — DIPHENHYDRAMINE HCL 12.5 MG/5ML PO ELIX: 12.5000 mg | ORAL_SOLUTION | Freq: Four times a day (QID) | ORAL | Status: DC | PRN

## 2019-06-13 MED ORDER — SUGAMMADEX SODIUM 200 MG/2ML IV SOLN
INTRAVENOUS | Status: DC | PRN
  Administered 2019-06-13: 200 mg via INTRAVENOUS

## 2019-06-13 MED ORDER — ROCURONIUM BROMIDE 10 MG/ML (PF) SYRINGE
PREFILLED_SYRINGE | INTRAVENOUS | Status: AC
  Filled 2019-06-13: qty 20

## 2019-06-13 MED ORDER — BISACODYL 10 MG RE SUPP: 10.0000 mg | Freq: Every day | RECTAL | Status: DC | PRN

## 2019-06-13 MED ORDER — GABAPENTIN 300 MG PO CAPS
ORAL_CAPSULE | ORAL | Status: AC
  Administered 2019-06-13: 300 mg
  Filled 2019-06-13: qty 1

## 2019-06-13 MED ORDER — LORAZEPAM 2 MG/ML IJ SOLN: 0.5000 mg | Freq: Three times a day (TID) | INTRAMUSCULAR | Status: DC | PRN

## 2019-06-13 MED ORDER — KETAMINE HCL 10 MG/ML IJ SOLN
INTRAMUSCULAR | Status: DC | PRN
  Administered 2019-06-13: 30 mg via INTRAVENOUS

## 2019-06-13 MED ORDER — METHYLENE BLUE 0.5 % INJ SOLN
INTRAVENOUS | Status: AC
  Filled 2019-06-13: qty 10

## 2019-06-13 MED ORDER — BUPIVACAINE-EPINEPHRINE 0.5% -1:200000 IJ SOLN
INTRAMUSCULAR | Status: DC | PRN
  Administered 2019-06-13: 30 mL

## 2019-06-13 MED ORDER — PIPERACILLIN-TAZOBACTAM 3.375 G IVPB 30 MIN
3.3750 g | Freq: Once | INTRAVENOUS | Status: AC
  Administered 2019-06-13: 3.375 g via INTRAVENOUS
  Filled 2019-06-13: qty 50

## 2019-06-13 MED ORDER — DEXAMETHASONE SODIUM PHOSPHATE 10 MG/ML IJ SOLN
INTRAMUSCULAR | Status: AC
  Filled 2019-06-13: qty 2

## 2019-06-13 MED ORDER — VANCOMYCIN HCL IN DEXTROSE 1-5 GM/200ML-% IV SOLN
1000.0000 mg | Freq: Once | INTRAVENOUS | Status: DC
  Administered 2019-06-13: 1000 mg via INTRAVENOUS
  Filled 2019-06-13: qty 200

## 2019-06-13 MED ORDER — MIDAZOLAM HCL 2 MG/2ML IJ SOLN: 0.5000 mg | Freq: Once | INTRAMUSCULAR | Status: DC | PRN

## 2019-06-13 MED ORDER — PROCHLORPERAZINE MALEATE 10 MG PO TABS: 10.0000 mg | ORAL_TABLET | Freq: Four times a day (QID) | ORAL | Status: DC | PRN

## 2019-06-13 MED ORDER — DEXAMETHASONE SODIUM PHOSPHATE 10 MG/ML IJ SOLN
INTRAMUSCULAR | Status: DC | PRN
  Administered 2019-06-13: 10 mg via INTRAVENOUS

## 2019-06-13 MED ORDER — LIDOCAINE 2% (20 MG/ML) 5 ML SYRINGE
INTRAMUSCULAR | Status: AC
  Filled 2019-06-13: qty 10

## 2019-06-13 MED ORDER — CEFAZOLIN SODIUM-DEXTROSE 2-4 GM/100ML-% IV SOLN
INTRAVENOUS | Status: AC
  Filled 2019-06-13: qty 100

## 2019-06-13 MED ORDER — MAGIC MOUTHWASH
15.0000 mL | Freq: Four times a day (QID) | ORAL | Status: DC | PRN
  Filled 2019-06-13: qty 15

## 2019-06-13 MED ORDER — SODIUM CHLORIDE (PF) 0.9 % IJ SOLN
INTRAMUSCULAR | Status: AC
  Filled 2019-06-13: qty 50

## 2019-06-13 MED ORDER — ONDANSETRON HCL 4 MG/2ML IJ SOLN
INTRAMUSCULAR | Status: DC | PRN
  Administered 2019-06-13: 4 mg via INTRAVENOUS

## 2019-06-13 MED ORDER — MIDAZOLAM HCL 2 MG/2ML IJ SOLN
INTRAMUSCULAR | Status: AC
  Filled 2019-06-13: qty 2

## 2019-06-13 MED ORDER — MIDAZOLAM HCL 5 MG/5ML IJ SOLN
INTRAMUSCULAR | Status: DC | PRN
  Administered 2019-06-13: 2 mg via INTRAVENOUS

## 2019-06-13 MED ORDER — GABAPENTIN 300 MG PO CAPS: 300.0000 mg | ORAL_CAPSULE | ORAL | Status: DC

## 2019-06-13 MED ORDER — DEXAMETHASONE SODIUM PHOSPHATE 10 MG/ML IJ SOLN
INTRAMUSCULAR | Status: AC
  Filled 2019-06-13: qty 1

## 2019-06-13 MED ORDER — BUPIVACAINE HCL 0.25 % IJ SOLN
INTRAMUSCULAR | Status: AC
  Filled 2019-06-13: qty 1

## 2019-06-13 MED ORDER — BUPIVACAINE LIPOSOME 1.3 % IJ SUSP
20.0000 mL | Freq: Once | INTRAMUSCULAR | Status: DC
Start: 2019-06-13 — End: 2019-06-13

## 2019-06-13 MED ORDER — LIDOCAINE-EPINEPHRINE-TETRACAINE (LET) TOPICAL GEL
3.0000 mL | Freq: Once | TOPICAL | Status: AC
  Administered 2019-06-13: 3 mL via TOPICAL
  Filled 2019-06-13: qty 3

## 2019-06-13 MED ORDER — PROMETHAZINE HCL 25 MG/ML IJ SOLN: 6.2500 mg | INTRAMUSCULAR | Status: DC | PRN

## 2019-06-13 MED ORDER — PROCHLORPERAZINE EDISYLATE 10 MG/2ML IJ SOLN: 5.0000 mg | Freq: Four times a day (QID) | INTRAMUSCULAR | Status: DC | PRN

## 2019-06-13 MED ORDER — LIDOCAINE 2% (20 MG/ML) 5 ML SYRINGE
INTRAMUSCULAR | Status: DC | PRN
  Administered 2019-06-13: 80 mg via INTRAVENOUS

## 2019-06-13 MED ORDER — KETAMINE HCL 10 MG/ML IJ SOLN
INTRAMUSCULAR | Status: AC
  Filled 2019-06-13: qty 1

## 2019-06-13 MED ORDER — ONDANSETRON HCL 4 MG/2ML IJ SOLN
4.0000 mg | Freq: Once | INTRAMUSCULAR | Status: AC
  Administered 2019-06-13: 4 mg via INTRAVENOUS
  Filled 2019-06-13: qty 2

## 2019-06-13 MED ORDER — CELECOXIB 200 MG PO CAPS
ORAL_CAPSULE | ORAL | Status: AC
  Filled 2019-06-13: qty 2

## 2019-06-13 MED ORDER — SUCCINYLCHOLINE CHLORIDE 200 MG/10ML IV SOSY
PREFILLED_SYRINGE | INTRAVENOUS | Status: DC | PRN
  Administered 2019-06-13: 120 mg via INTRAVENOUS

## 2019-06-13 MED ORDER — SIMETHICONE 80 MG PO CHEW: 40.0000 mg | CHEWABLE_TABLET | Freq: Four times a day (QID) | ORAL | Status: DC | PRN

## 2019-06-13 MED ORDER — ROCURONIUM BROMIDE 10 MG/ML (PF) SYRINGE
PREFILLED_SYRINGE | INTRAVENOUS | Status: DC | PRN
  Administered 2019-06-13: 30 mg via INTRAVENOUS
  Administered 2019-06-13: 10 mg via INTRAVENOUS

## 2019-06-13 MED ORDER — ONDANSETRON HCL 4 MG/2ML IJ SOLN
4.0000 mg | Freq: Four times a day (QID) | INTRAMUSCULAR | Status: DC | PRN
  Administered 2019-06-14: 4 mg via INTRAVENOUS
  Filled 2019-06-13: qty 2

## 2019-06-13 MED ORDER — FENTANYL CITRATE (PF) 100 MCG/2ML IJ SOLN
50.0000 ug | Freq: Once | INTRAMUSCULAR | Status: AC
  Administered 2019-06-13: 50 ug via INTRAVENOUS
  Filled 2019-06-13: qty 2

## 2019-06-13 MED ORDER — CHLORHEXIDINE GLUCONATE CLOTH 2 % EX PADS
6.0000 | MEDICATED_PAD | Freq: Once | CUTANEOUS | Status: AC
  Administered 2019-06-13: 6 via TOPICAL

## 2019-06-13 MED ORDER — PIPERACILLIN-TAZOBACTAM 3.375 G IVPB
3.3750 g | Freq: Three times a day (TID) | INTRAVENOUS | Status: DC
  Administered 2019-06-13 – 2019-06-15 (×5): 3.375 g via INTRAVENOUS
  Filled 2019-06-13 (×6): qty 50

## 2019-06-13 MED ORDER — MORPHINE SULFATE (PF) 4 MG/ML IV SOLN
4.0000 mg | Freq: Once | INTRAVENOUS | Status: AC
  Administered 2019-06-13: 4 mg via INTRAVENOUS
  Filled 2019-06-13: qty 1

## 2019-06-13 MED ORDER — FENTANYL CITRATE (PF) 100 MCG/2ML IJ SOLN
INTRAMUSCULAR | Status: DC | PRN
  Administered 2019-06-13: 100 ug via INTRAVENOUS
  Administered 2019-06-13: 50 ug via INTRAVENOUS

## 2019-06-13 MED ORDER — FENTANYL CITRATE (PF) 250 MCG/5ML IJ SOLN
INTRAMUSCULAR | Status: AC
  Filled 2019-06-13: qty 5

## 2019-06-13 MED ORDER — PROPOFOL 10 MG/ML IV BOLUS
INTRAVENOUS | Status: DC | PRN
  Administered 2019-06-13: 200 mg via INTRAVENOUS

## 2019-06-13 MED ORDER — HYDROMORPHONE HCL 1 MG/ML IJ SOLN
0.5000 mg | INTRAMUSCULAR | Status: DC | PRN
  Administered 2019-06-13 – 2019-06-14 (×4): 1 mg via INTRAVENOUS
  Filled 2019-06-13 (×3): qty 1
  Filled 2019-06-13: qty 2

## 2019-06-13 MED ORDER — SODIUM CHLORIDE 0.9% FLUSH
3.0000 mL | Freq: Two times a day (BID) | INTRAVENOUS | Status: DC
  Administered 2019-06-14: 3 mL via INTRAVENOUS

## 2019-06-13 MED ORDER — ONDANSETRON 4 MG PO TBDP: 4.0000 mg | ORAL_TABLET | Freq: Four times a day (QID) | ORAL | Status: DC | PRN

## 2019-06-13 SURGICAL SUPPLY — 33 items
BLADE SURG 15 STRL LF DISP TIS (BLADE) ×1 IMPLANT
BLADE SURG 15 STRL SS (BLADE)
BRIEF STRETCH FOR OB PAD LRG (UNDERPADS AND DIAPERS) ×3 IMPLANT
COVER SURGICAL LIGHT HANDLE (MISCELLANEOUS) ×3 IMPLANT
COVER WAND RF STERILE (DRAPES) IMPLANT
DRAPE LAPAROTOMY T 102X78X121 (DRAPES) ×3 IMPLANT
DRSG PAD ABDOMINAL 8X10 ST (GAUZE/BANDAGES/DRESSINGS) ×3 IMPLANT
ELECT REM PT RETURN 15FT ADLT (MISCELLANEOUS) ×3 IMPLANT
GAUZE 4X4 16PLY RFD (DISPOSABLE) ×3 IMPLANT
GAUZE SPONGE 4X4 12PLY STRL (GAUZE/BANDAGES/DRESSINGS) ×3 IMPLANT
GLOVE ECLIPSE 8.0 STRL XLNG CF (GLOVE) ×3 IMPLANT
GLOVE INDICATOR 8.0 STRL GRN (GLOVE) ×3 IMPLANT
GOWN STRL REUS W/TWL XL LVL3 (GOWN DISPOSABLE) ×6 IMPLANT
KIT BASIN (CUSTOM PROCEDURE TRAY) ×3 IMPLANT
KIT TURNOVER KIT A (KITS) IMPLANT
NEEDLE HYPO 22GX1.5 SAFETY (NEEDLE) ×3 IMPLANT
PACK BASIC VI WITH GOWN DISP (CUSTOM PROCEDURE TRAY) ×3 IMPLANT
PACK GENERAL/GYN (CUSTOM PROCEDURE TRAY) ×2 IMPLANT
PENCIL SMOKE EVACUATOR (MISCELLANEOUS) IMPLANT
SUCTION FRAZIER HANDLE 12FR (TUBING)
SUCTION TUBE FRAZIER 12FR DISP (TUBING) IMPLANT
SURGILUBE 2OZ TUBE FLIPTOP (MISCELLANEOUS) ×3 IMPLANT
SUT CHROMIC 2 0 SH (SUTURE) IMPLANT
SUT CHROMIC 3 0 SH 27 (SUTURE) IMPLANT
SUT VIC AB 2-0 UR6 27 (SUTURE) IMPLANT
SWAB COLLECTION DEVICE MRSA (MISCELLANEOUS) IMPLANT
SWAB CULTURE ESWAB REG 1ML (MISCELLANEOUS) IMPLANT
SYR 20ML LL LF (SYRINGE) ×3 IMPLANT
SYR 3ML LL SCALE MARK (SYRINGE) IMPLANT
SYR BULB IRRIGATION 50ML (SYRINGE) IMPLANT
TOWEL OR 17X26 10 PK STRL BLUE (TOWEL DISPOSABLE) ×3 IMPLANT
TOWEL OR NON WOVEN STRL DISP B (DISPOSABLE) ×3 IMPLANT
YANKAUER SUCT BULB TIP 10FT TU (MISCELLANEOUS) ×3 IMPLANT

## 2019-06-13 NOTE — Anesthesia Preprocedure Evaluation (Addendum)
Anesthesia Evaluation  Patient identified by MRN, date of birth, ID band Patient awake    Reviewed: Allergy & Precautions, NPO status , Patient's Chart, lab work & pertinent test results  History of Anesthesia Complications Negative for: history of anesthetic complications  Airway Mallampati: II  TM Distance: >3 FB Neck ROM: Full    Dental  (+) Dental Advisory Given, Missing   Pulmonary Current Smoker and Patient abstained from smoking.,  06/13/2019 SARS coronavirus NEG   breath sounds clear to auscultation       Cardiovascular (-) anginanegative cardio ROS   Rhythm:Regular Rate:Normal     Neuro/Psych negative neurological ROS     GI/Hepatic Neg liver ROS, GERD  Medicated and Controlled,  Endo/Other  negative endocrine ROS  Renal/GU negative Renal ROS     Musculoskeletal   Abdominal   Peds  Hematology  (+) Blood dyscrasia (Hb 11.4), anemia ,   Anesthesia Other Findings   Reproductive/Obstetrics S/p TAH                           Anesthesia Physical Anesthesia Plan  ASA: II and emergent  Anesthesia Plan: General   Post-op Pain Management:    Induction: Intravenous, Rapid sequence and Cricoid pressure planned  PONV Risk Score and Plan: Ondansetron and Dexamethasone  Airway Management Planned: Oral ETT  Additional Equipment: None  Intra-op Plan:   Post-operative Plan: Extubation in OR  Informed Consent: I have reviewed the patients History and Physical, chart, labs and discussed the procedure including the risks, benefits and alternatives for the proposed anesthesia with the patient or authorized representative who has indicated his/her understanding and acceptance.     Dental advisory given  Plan Discussed with: CRNA and Surgeon  Anesthesia Plan Comments:        Anesthesia Quick Evaluation

## 2019-06-13 NOTE — ED Triage Notes (Signed)
Pt BIB [facility] for an abscess on her R buttocks x 1 week. States that it has worsened each day. Unable to sit properly. Denies discharge. Hx of same in that area.

## 2019-06-13 NOTE — H&P (View-Only) (Signed)
[name] [name]  1980/04/20 [id number]  CARE TEAM:  PCP: Patient, No Pcp Per  Outpatient Care Team: Patient Care Team: Patient, No Pcp Per as PCP - General (General Practice) Florian Buff, MD as Consulting Physician (Obstetrics and Gynecology)  Inpatient Treatment Team: Treatment Team: Attending Provider: Wyvonnia Dusky, MD; Physician Assistant: Rodney Booze, PA-C; Technician: Laurice Record, NT; Registered Nurse: Garen Grams, RN; Technician: Drinda Butts, NT; Consulting Physician: Edison Pace, Md, MD   This patient is a 39 y.o.female who presents today for surgical evaluation at the request of [name], PA-C.  Chief complaint / Reason for evaluation: Abscess of right perirectal region  The patient first noticed a painful bump on her right glute last Saturday, which over the course of the past week became increasingly painful and large. She has not taken antibiotics for the current episode.  She states she has had "boils" in the same area in the past, which have always resolved on their own.  This time the area got much larger and more painful.  She has not moved her bowels since last Saturday due to the pressure from her abscess.  CT scan confirmed 11.7x4.6x7.0cm abscess extending into the ischioanal fat and medial right gluteal cleft, with possibility of right posterior perianal fistula.   She is a current smoker. Non-diabetic. Immunocompetent.  Admits to having hemorrhoids. She has not eaten since yesterday evening.  Assessment  [name] [name]  39 y.o. female  Day of Surgery  Procedure(s): exam under anesthesia incision and drainage peri rectal abcess  Problem List:  Principal Problem:   Ischiorectal abscess and fistula Active Problems:   Tobacco abuse   Plan: Plan for exam under anesthesia and incision and drainage of her peri rectal abscess this afternoon. Possible seton placement if fistula present. Admit the patient for IV antibiotics post-operatively.    Patient to follow-up in our office post-operatively. Will likely need a second surgery to repair her right posterior perianal fistula. Discussed this with patient and she is agreeable.   -Start IV Zosyn -NPO -VTE prophylaxis- SCDs, etc -Tobacco cessation encouraged  30 minutes spent in review, evaluation, examination, counseling, and coordination of care.  More than 50% of that time was spent in counseling.  06/13/2019      Past Medical History:  Diagnosis Date  . Anemia   . Blood transfusion without reported diagnosis   . Late prenatal care complicating pregnancy in second trimester 09/30/2012   At 21.1 weeks   . Pregnant     Past Surgical History:  Procedure Laterality Date  . ABDOMINAL HYSTERECTOMY    . TUBAL LIGATION      Social History   Socioeconomic History  . Marital status: Divorced    Spouse name: Not on file  . Number of children: Not on file  . Years of education: Not on file  . Highest education level: Not on file  Occupational History  . Not on file  Tobacco Use  . Smoking status: Current Some Day Smoker    Packs/day: 0.50    Years: 5.00    Pack years: 2.50    Types: Cigarettes  . Smokeless tobacco: Never Used  Substance and Sexual Activity  . Alcohol use: Yes    Comment: occasional  . Drug use: Yes    Types: Cocaine    Comment: 3 days ago  . Sexual activity: Yes    Birth control/protection: Surgical  Other Topics Concern  . Not on  file  Social History Narrative  . Not on file   Social Determinants of Health   Financial Resource Strain:   . Difficulty of Paying Living Expenses:   Food Insecurity:   . Worried About Charity fundraiser in the Last Year:   . Arboriculturist in the Last Year:   Transportation Needs:   . Film/video editor (Medical):   Marland Kitchen Lack of Transportation (Non-Medical):   Physical Activity:   . Days of Exercise per Week:   . Minutes of Exercise per Session:   Stress:   . Feeling of Stress :   Social  Connections:   . Frequency of Communication with Friends and Family:   . Frequency of Social Gatherings with Friends and Family:   . Attends Religious Services:   . Active Member of Clubs or Organizations:   . Attends Archivist Meetings:   Marland Kitchen Marital Status:   Intimate Partner Violence:   . Fear of Current or Ex-Partner:   . Emotionally Abused:   Marland Kitchen Physically Abused:   . Sexually Abused:     Family History  Problem Relation Age of Onset  . Healthy Mother   . Diabetes Other   . Healthy Father     Current Facility-Administered Medications  Medication Dose Route Frequency Provider Last Rate Last Admin  . acetaminophen (TYLENOL) tablet 1,000 mg  1,000 mg Oral On Call to OR Michael Boston, MD      . bupivacaine liposome (EXPAREL) 1.3 % injection 266 mg  20 mL Infiltration Once Michael Boston, MD      . ceFAZolin (ANCEF) IVPB 2g/100 mL premix  2 g Intravenous On Call to OR Michael Boston, MD      . celecoxib (CELEBREX) capsule 400 mg  400 mg Oral On Call to OR Michael Boston, MD      . Chlorhexidine Gluconate Cloth 2 % PADS 6 each  6 each Topical Once Michael Boston, MD       And  . Chlorhexidine Gluconate Cloth 2 % PADS 6 each  6 each Topical Once Michael Boston, MD      . gabapentin (NEURONTIN) capsule 300 mg  300 mg Oral On Call to OR Michael Boston, MD      . HYDROmorphone (DILAUDID) injection 0.5-2 mg  0.5-2 mg Intravenous Q1H PRN Michael Boston, MD   1 mg at 06/13/19 1224  . metroNIDAZOLE (FLAGYL) IVPB 500 mg  500 mg Intravenous On Call to OR Michael Boston, MD      . morphine 4 MG/ML injection 4 mg  4 mg Intravenous Once Couture, Cortni S, PA-C      . sodium chloride (PF) 0.9 % injection            Current Outpatient Medications  Medication Sig Dispense Refill  . ibuprofen (ADVIL,MOTRIN) 600 MG tablet Take 1 tablet (600 mg total) by mouth every 6 (six) hours as needed. 30 tablet 0  . lidocaine (XYLOCAINE) 2 % solution Use as directed 15 mLs in the mouth or throat every 3  (three) hours as needed for mouth pain. 100 mL 0  . pantoprazole (PROTONIX) 20 MG tablet Take 1 tablet (20 mg total) by mouth daily. 30 tablet 1  . phenol (CHLORASEPTIC) 1.4 % LIQD Use as directed 1 spray in the mouth or throat as needed for throat irritation / pain.       No Known Allergies  ROS:   All other systems reviewed & are negative except per  HPI or as noted below: Constitutional:  No fevers, chills, sweats.  Weight stable Eyes:  No vision changes, No discharge HENT:  No sore throats, nasal drainage Lymph: No neck swelling, No bruising easily Pulmonary:  No cough, productive sputum CV: No orthopnea, PND  Patient walks >30 minutes without difficulty.  No exertional chest/neck/shoulder/arm pain. GI: Admits to constipation. Last bowel movement last Saturday. No personal nor family history of GI/colon cancer, inflammatory bowel disease, irritable bowel syndrome, allergy such as Celiac Sprue, dietary/dairy problems, colitis, ulcers nor gastritis.  No recent sick contacts/gastroenteritis.  No travel outside the country.  No changes in diet. Renal: No UTIs, No hematuria Genital:  No drainage, bleeding, masses Musculoskeletal: No severe joint pain.  Good ROM major joints Skin:  No sores or lesions.  No rashes. Right peri rectal region with visible abscess. Warm and erythematous.   Heme/Lymph:  No easy bleeding.  No swollen lymph nodes Neuro: No focal weakness/numbness.  No seizures Psych: No suicidal ideation.  No hallucinations  BP 125/76   Pulse 94   Temp 97.6 F (36.4 C) (Oral)   Resp 18   LMP 06/06/2014 (Exact Date)   SpO2 99%   Physical Exam: Constitutional: Not cachectic.  Hygeine adequate.  Vitals signs as above.   Eyes: Pupils reactive, normal extraocular movements. Sclera nonicteric Neuro: CN II-XII intact.  No major focal sensory defects.  No major motor deficits. Lymph: No head/neck/groin lymphadenopathy Psych:  No severe agitation.  No severe anxiety.  Judgment &  insight Adequate, Oriented x4 HENT: Normocephalic, Mucus membranes moist.  No thrush.   Neck: Supple, No tracheal deviation.  No obvious thyromegaly Chest: No pain to chest wall compression.  Good respiratory excursion.  No audible wheezing CV:  Pulses intact.  Regular rhythm.  No major extremity edema Abdomen: Soft, Nondistended.  Nontender.  No incarcerated hernias.  No hepatomegaly.  No splenomegaly Gen:  No inguinal hernias.  No inguinal lymphadenopathy.   Ext: No obvious deformity or contracture no significant edema.  No cyanosis Skin: Visible right peri rectal abscess. Overlying warmth and erythema.  Musculoskeletal: Severe joint rigidity not present.  No obvious clubbing.  No digital petechiae.     Results:   Labs: Results for orders placed or performed during the hospital encounter of 06/13/19 (from the past 48 hour(s))  CBC with Differential     Status: Abnormal   Collection Time: 06/13/19  9:18 AM  Result Value Ref Range   WBC 27.5 (H) 4.0 - 10.5 K/uL   RBC 3.77 (L) 3.87 - 5.11 MIL/uL   Hemoglobin 11.4 (L) 12.0 - 15.0 g/dL   HCT 34.9 (L) 36.0 - 46.0 %   MCV 92.6 80.0 - 100.0 fL   MCH 30.2 26.0 - 34.0 pg   MCHC 32.7 30.0 - 36.0 g/dL   RDW 13.4 11.5 - 15.5 %   Platelets 309 150 - 400 K/uL   nRBC 0.0 0.0 - 0.2 %   Neutrophils Relative % 89 %   Neutro Abs 25.3 (H) 1.7 - 7.7 K/uL   Band Neutrophils 3 %   Lymphocytes Relative 3 %   Lymphs Abs 0.8 0.7 - 4.0 K/uL   Monocytes Relative 5 %   Monocytes Absolute 1.4 (H) 0.1 - 1.0 K/uL   Eosinophils Relative 0 %   Eosinophils Absolute 0.0 0.0 - 0.5 K/uL   Basophils Relative 0 %   Basophils Absolute 0.0 0.0 - 0.1 K/uL   Abs Immature Granulocytes 0.00 0.00 - 0.07 K/uL  Comment: Performed at [facility], [place] [address], [place], [place] [id number]  Basic metabolic panel     Status: Abnormal   Collection Time: 06/13/19  9:18 AM  Result Value Ref Range   Sodium 138 135 - 145 mmol/L   Potassium 3.2 (L) 3.5 -  5.1 mmol/L   Chloride 104 98 - 111 mmol/L   CO2 22 22 - 32 mmol/L   Glucose, Bld 120 (H) 70 - 99 mg/dL    Comment: Glucose reference range applies only to samples taken after fasting for at least 8 hours.   BUN 8 6 - 20 mg/dL   Creatinine, Ser 0.67 0.44 - 1.00 mg/dL   Calcium 9.3 8.9 - 10.3 mg/dL   GFR calc non Af Amer >60 >60 mL/min   GFR calc Af Amer >60 >60 mL/min   Anion gap 12 5 - 15    Comment: Performed at [facility], [place] [address], [place], [place] [address]  Lactic acid, plasma     Status: None   Collection Time: 06/13/19 10:24 AM  Result Value Ref Range   Lactic Acid, Venous 1.2 0.5 - 1.9 mmol/L    Comment: Performed at [facility], [place] [address], [place],  [address]    Imaging / Studies: CT PELVIS W CONTRAST  Result Date: 06/13/2019 CLINICAL DATA:  Complex right buttock abscess for 1 week. EXAM: CT PELVIS WITH CONTRAST TECHNIQUE: Multidetector CT imaging of the pelvis was performed using the standard protocol following the bolus administration of intravenous contrast. CONTRAST:  122mL OMNIPAQUE IOHEXOL 300 MG/ML  SOLN COMPARISON:  None. FINDINGS: Urinary Tract: Normal urinary bladder. Normal caliber pelvic ureters. Bowel: Circumferential anal wall thickening. No small or large bowel wall thickening. Large right perianal abscess measuring 11.7 x 4.6 x 7.0 cm (series 3/image 94) extending into the ischioanal fat and medial right gluteal cleft, with surrounding fat stranding. 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 Vascular/Lymphatic: No acute vascular abnormality. No pathologically enlarged pelvic lymph nodes. Reproductive: Status post hysterectomy, with no abnormal findings at the vaginal cuff. No adnexal mass. Other:  No pelvic pneumoperitoneum or ascites. Musculoskeletal: No aggressive appearing focal osseous lesions. IMPRESSION: Large 11.7 x 4.6 x 7.0 cm right perianal abscess extending into the ischioanal fat and  medial right gluteal cleft. 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 Electronically Signed   By: Ilona Sorrel M.D.   On: 06/13/2019 11:07    Medications / Allergies: per chart  Antibiotics: Anti-infectives (From admission, onward)   Start     Dose/Rate Route Frequency Ordered Stop   06/13/19 1200  ceFAZolin (ANCEF) IVPB 2g/100 mL premix     2 g 200 mL/hr over 30 Minutes Intravenous On call to O.R. 06/13/19 1156 06/14/19 0559   06/13/19 1200  metroNIDAZOLE (FLAGYL) IVPB 500 mg     500 mg 100 mL/hr over 60 Minutes Intravenous On call to O.R. 06/13/19 1156 06/14/19 0559   06/13/19 1100  vancomycin (VANCOCIN) IVPB 1000 mg/200 mL premix  Status:  Discontinued     1,000 mg 200 mL/hr over 60 Minutes Intravenous  Once 06/13/19 1048 06/13/19 1157   06/13/19 1030  piperacillin-tazobactam (ZOSYN) IVPB 3.375 g     3.375 g 100 mL/hr over 30 Minutes Intravenous  Once 06/13/19 1024 06/13/19 1130        Note: Portions of this report may have been transcribed using voice recognition software. Every effort was made to ensure accuracy; however, inadvertent computerized transcription errors may be  present.   Any transcriptional errors that result from this process are unintentional.    Orbie Pyo, PA-S Patient was seen and evaluated in coordination with Kandis Ban, MD   06/13/2019

## 2019-06-13 NOTE — Transfer of Care (Signed)
Immediate Anesthesia Transfer of Care Note  Patient: [name] [name]  Procedure(s) Performed: Procedure(s): exam under anesthesia incision and drainage peri rectal abcess (N/A)  Patient Location: PACU  Anesthesia Type:General  Level of Consciousness:  sedated, patient cooperative and responds to stimulation  Airway & Oxygen Therapy:Patient Spontanous Breathing and Patient connected to face mask oxgen  Post-op Assessment:  Report given to PACU RN and Post -op Vital signs reviewed and stable  Post vital signs:  Reviewed and stable  Last Vitals:  Vitals:   06/13/19 1330 06/13/19 1533  BP: 103/68 (!) (P) 134/92  Pulse: (!) 106 (!) 136  Resp: 16 (!) 22  Temp:  (P) 36.7 C  SpO2: 0000000 123XX123    Complications: No apparent anesthesia complications

## 2019-06-13 NOTE — Anesthesia Procedure Notes (Signed)
Procedure Name: Intubation Date/Time: 06/13/2019 2:52 PM Performed by: Lavina Hamman, CRNA Pre-anesthesia Checklist: Patient identified, Emergency Drugs available, Suction available and Patient being monitored Patient Re-evaluated:Patient Re-evaluated prior to induction Oxygen Delivery Method: Circle System Utilized and Circle system utilized Preoxygenation: Pre-oxygenation with 100% oxygen Induction Type: IV induction, Rapid sequence and Cricoid Pressure applied Laryngoscope Size: Mac and 3 Grade View: Grade I Tube type: Oral Tube size: 7.0 mm Number of attempts: 1 Airway Equipment and Method: Stylet Placement Confirmation: ETT inserted through vocal cords under direct vision,  positive ETCO2 and breath sounds checked- equal and bilateral Secured at: 21 cm Tube secured with: Tape Dental Injury: Teeth and Oropharynx as per pre-operative assessment  Comments: ATOI

## 2019-06-13 NOTE — ED Provider Notes (Signed)
.  Critical Care Performed by: Wyvonnia Dusky, MD Authorized by: Wyvonnia Dusky, MD   Critical care provider statement:    Critical care time (minutes):  40   Critical care was necessary to treat or prevent imminent or life-threatening deterioration of the following conditions:  Sepsis   Critical care was time spent personally by me on the following activities:  Discussions with consultants, evaluation of patient's response to treatment, examination of patient, ordering and performing treatments and interventions, ordering and review of laboratory studies, ordering and review of radiographic studies, pulse oximetry, re-evaluation of patient's condition, obtaining history from patient or surrogate and review of old charts      [name] [name], Carola Rhine, MD 06/13/19 1750

## 2019-06-13 NOTE — Interval H&P Note (Signed)
History and Physical Interval Note:  06/13/2019 1:50 PM  [name] [name]  has presented today for surgery, with the diagnosis of peri rectal abcess.  The various methods of treatment have been discussed with the patient and family. After consideration of risks, benefits and other options for treatment, the patient has consented to  Procedure(s): exam under anesthesia incision and drainage peri rectal abcess (N/A) as a surgical intervention.  The patient's history has been reviewed, patient examined, no change in status, stable for surgery.  I have reviewed the patient's chart and labs.  Questions were answered to the patient's satisfaction.    I have re-reviewed the the patient's records, history, medications, and allergies.  I have re-examined the patient.  I again discussed intraoperative plans and goals of post-operative recovery.  The patient agrees to proceed.  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   Tobacco abuse 06/13/2019   Anemia 10/08/2011    Past Medical History:  Diagnosis Date   Anemia    Blood transfusion without reported diagnosis    Late prenatal care complicating pregnancy in second trimester 09/30/2012   At 21.[redacted] weeks    Pregnant     Past Surgical History:  Procedure Laterality Date   ABDOMINAL HYSTERECTOMY     TUBAL LIGATION      Social History   Socioeconomic History   Marital status: Divorced    Spouse name: Not on file   Number of children: Not on file   Years of education: Not on file   Highest education level: Not on file  Occupational History   Not on file  Tobacco Use   Smoking status: Current Some Day Smoker    Packs/day: 0.50    Years: 5.00    Pack years: 2.50    Types: Cigarettes   Smokeless tobacco:  Never Used  Substance and Sexual Activity   Alcohol use: Yes    Comment: occasional   Drug use: Yes    Types: Cocaine    Comment: 3 days ago   Sexual activity: Yes    Birth control/protection: Surgical  Other Topics Concern   Not on file  Social History Narrative   Not on file   Social Determinants of Health   Financial Resource Strain:    Difficulty of Paying Living Expenses:   Food Insecurity:    Worried About Charity fundraiser in the Last Year:    Arboriculturist in the Last Year:   Transportation Needs:    Film/video editor (Medical):    Lack of Transportation (Non-Medical):   Physical Activity:    Days of Exercise per Week:    Minutes of Exercise per Session:   Stress:    Feeling of Stress :   Social Connections:    Frequency of Communication with Friends and Family:    Frequency of Social Gatherings with Friends and Family:    Attends Religious Services:    Active Member of Clubs or Organizations:    Attends Music therapist:    Marital Status:   Intimate Partner Violence:    Fear of Current or Ex-Partner:    Emotionally Abused:    Physically Abused:    Sexually Abused:     Family History  Problem  Relation Age of Onset   Healthy Mother    Diabetes Other    Healthy Father     (Not in a hospital admission)   Current Facility-Administered Medications  Medication Dose Route Frequency Provider Last Rate Last Admin   acetaminophen (TYLENOL) 500 MG tablet            acetaminophen (TYLENOL) tablet 1,000 mg  1,000 mg Oral On Call to OR Michael Boston, MD       bupivacaine liposome (EXPAREL) 1.3 % injection 266 mg  20 mL Infiltration Once Michael Boston, MD       ceFAZolin (ANCEF) 2-4 GM/100ML-% IVPB            ceFAZolin (ANCEF) IVPB 2g/100 mL premix  2 g Intravenous On Call to OR Michael Boston, MD       celecoxib (CELEBREX) 200 MG capsule            celecoxib (CELEBREX) capsule 400 mg  400 mg Oral On Call to OR Michael Boston, MD        Chlorhexidine Gluconate Cloth 2 % PADS 6 each  6 each Topical Once Michael Boston, MD       And   Chlorhexidine Gluconate Cloth 2 % PADS 6 each  6 each Topical Once Michael Boston, MD       gabapentin (NEURONTIN) 300 MG capsule            gabapentin (NEURONTIN) capsule 300 mg  300 mg Oral On Call to OR Michael Boston, MD       HYDROmorphone (DILAUDID) injection 0.5-2 mg  0.5-2 mg Intravenous Q1H PRN Michael Boston, MD   1 mg at 06/13/19 1339   lactated ringers bolus 1,000 mL  1,000 mL Intravenous Q8H PRN Michael Boston, MD       lip balm (CARMEX) ointment 1 application  1 application Topical BID Michael Boston, MD       magic mouthwash  15 mL Oral QID PRN Michael Boston, MD       metroNIDAZOLE (FLAGYL) IVPB 500 mg  500 mg Intravenous On Call to OR Michael Boston, MD       morphine 4 MG/ML injection 4 mg  4 mg Intravenous Once Couture, Cortni S, PA-C       sodium chloride (PF) 0.9 % injection            Current Outpatient Medications  Medication Sig Dispense Refill   ibuprofen (ADVIL,MOTRIN) 600 MG tablet Take 1 tablet (600 mg total) by mouth every 6 (six) hours as needed. 30 tablet 0   lidocaine (XYLOCAINE) 2 % solution Use as directed 15 mLs in the mouth or throat every 3 (three) hours as needed for mouth pain. 100 mL 0   pantoprazole (PROTONIX) 20 MG tablet Take 1 tablet (20 mg total) by mouth daily. 30 tablet 1   phenol (CHLORASEPTIC) 1.4 % LIQD Use as directed 1 spray in the mouth or throat as needed for throat irritation / pain.       No Known Allergies  BP 103/68 (BP Location: Right Arm)   Pulse (!) 106   Temp 97.6 F (36.4 C) (Oral)   Resp 16   LMP 06/06/2014 (Exact Date)   SpO2 97%   Labs: Results for orders placed or performed during the hospital encounter of 06/13/19 (from the past 48 hour(s))  CBC with Differential     Status: Abnormal   Collection Time: 06/13/19  9:18 AM  Result Value Ref Range   WBC  27.5 (H) 4.0 - 10.5 K/uL   RBC 3.77 (L) 3.87 - 5.11 MIL/uL   Hemoglobin  11.4 (L) 12.0 - 15.0 g/dL   HCT 34.9 (L) 36.0 - 46.0 %   MCV 92.6 80.0 - 100.0 fL   MCH 30.2 26.0 - 34.0 pg   MCHC 32.7 30.0 - 36.0 g/dL   RDW 13.4 11.5 - 15.5 %   Platelets 309 150 - 400 K/uL   nRBC 0.0 0.0 - 0.2 %   Neutrophils Relative % 89 %   Neutro Abs 25.3 (H) 1.7 - 7.7 K/uL   Band Neutrophils 3 %   Lymphocytes Relative 3 %   Lymphs Abs 0.8 0.7 - 4.0 K/uL   Monocytes Relative 5 %   Monocytes Absolute 1.4 (H) 0.1 - 1.0 K/uL   Eosinophils Relative 0 %   Eosinophils Absolute 0.0 0.0 - 0.5 K/uL   Basophils Relative 0 %   Basophils Absolute 0.0 0.0 - 0.1 K/uL   Abs Immature Granulocytes 0.00 0.00 - 0.07 K/uL    Comment: Performed at [facility], [place] [address], [place], [place] [id number]  Basic metabolic panel     Status: Abnormal   Collection Time: 06/13/19  9:18 AM  Result Value Ref Range   Sodium 138 135 - 145 mmol/L   Potassium 3.2 (L) 3.5 - 5.1 mmol/L   Chloride 104 98 - 111 mmol/L   CO2 22 22 - 32 mmol/L   Glucose, Bld 120 (H) 70 - 99 mg/dL    Comment: Glucose reference range applies only to samples taken after fasting for at least 8 hours.   BUN 8 6 - 20 mg/dL   Creatinine, Ser 0.67 0.44 - 1.00 mg/dL   Calcium 9.3 8.9 - 10.3 mg/dL   GFR calc non Af Amer >60 >60 mL/min   GFR calc Af Amer >60 >60 mL/min   Anion gap 12 5 - 15    Comment: Performed at [facility], [place] [address], [place], [place] [address]  Lactic acid, plasma     Status: None   Collection Time: 06/13/19 10:24 AM  Result Value Ref Range   Lactic Acid, Venous 1.2 0.5 - 1.9 mmol/L    Comment: Performed at [facility], [place] [address] [place], [place] [address]  Respiratory Panel by RT PCR (Flu A&B, Covid) - Nasopharyngeal Swab     Status: None   Collection Time: 06/13/19 11:41 AM   Specimen: Nasopharyngeal Swab  Result Value Ref Range   SARS Coronavirus 2 by RT PCR NEGATIVE NEGATIVE    Comment: (NOTE) SARS-CoV-2 target nucleic acids are NOT  DETECTED. The SARS-CoV-2 RNA is generally detectable in upper respiratoy specimens during the acute phase of infection. The lowest concentration of SARS-CoV-2 viral copies this assay can detect is 131 copies/mL. A negative result does not preclude SARS-Cov-2 infection and should not be used as the sole basis for treatment or other patient management decisions. A negative result may occur with  improper specimen collection/handling, submission of specimen other than nasopharyngeal swab, presence of viral mutation(s) within the areas targeted by this assay, and inadequate number of viral copies (<131 copies/mL). A negative result must be combined with clinical observations, patient history, and epidemiological information. The expected result is Negative. Fact Sheet for Patients:  PinkCheek.be Fact Sheet for Healthcare Providers:  GravelBags.it This test is not yet ap proved or cleared by the [place] FDA and  has been authorized for detection and/or diagnosis of  SARS-CoV-2 by FDA under an Emergency Use Authorization (EUA). This EUA will remain  in effect (meaning this test can be used) for the duration of the COVID-19 declaration under Section 564(b)(1) of the Act, 21 U.S.C. section 360bbb-3(b)(1), unless the authorization is terminated or revoked sooner.    Influenza A by PCR NEGATIVE NEGATIVE   Influenza B by PCR NEGATIVE NEGATIVE    Comment: (NOTE) The Xpert Xpress SARS-CoV-2/FLU/RSV assay is intended as an aid in  the diagnosis of influenza from Nasopharyngeal swab specimens and  should not be used as a sole basis for treatment. Nasal washings and  aspirates are unacceptable for Xpert Xpress SARS-CoV-2/FLU/RSV  testing. Fact Sheet for Patients: PinkCheek.be Fact Sheet for Healthcare Providers: GravelBags.it This test is not yet approved or cleared by the  [place] FDA and  has been authorized for detection and/or diagnosis of SARS-CoV-2 by  FDA under an Emergency Use Authorization (EUA). This EUA will remain  in effect (meaning this test can be used) for the duration of the  Covid-19 declaration under Section 564(b)(1) of the Act, 21  U.S.C. section 360bbb-3(b)(1), unless the authorization is  terminated or revoked. Performed at [facility], [place] [address], [place], [place] [address]     Imaging / Studies: CT PELVIS W CONTRAST  Result Date: 06/13/2019 CLINICAL DATA:  Complex right buttock abscess for 1 week. EXAM: CT PELVIS WITH CONTRAST TECHNIQUE: Multidetector CT imaging of the pelvis was performed using the standard protocol following the bolus administration of intravenous contrast. CONTRAST:  128mL OMNIPAQUE IOHEXOL 300 MG/ML  SOLN COMPARISON:  None. FINDINGS: Urinary Tract: Normal urinary bladder. Normal caliber pelvic ureters. Bowel: Circumferential anal wall thickening. No small or large bowel wall thickening. Large right perianal abscess measuring 11.7 x 4.6 x 7.0 cm (series 3/image 94) extending into the ischioanal fat and medial right gluteal cleft, with surrounding fat stranding. 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 Vascular/Lymphatic: No acute vascular abnormality. No pathologically enlarged pelvic lymph nodes. Reproductive: Status post hysterectomy, with no abnormal findings at the vaginal cuff. No adnexal mass. Other:  No pelvic pneumoperitoneum or ascites. Musculoskeletal: No aggressive appearing focal osseous lesions. IMPRESSION: Large 11.7 x 4.6 x 7.0 cm right perianal abscess extending into the ischioanal fat and medial right gluteal cleft. 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 Electronically Signed   By: Ilona Sorrel M.D.   On: 06/13/2019 11:07     .Adin Hector, M.D., F.A.C.S. Gastrointestinal and Minimally Invasive Surgery [facility] [address] [place], [place] [address] [phone] Main / Paging  06/13/2019 1:50 PM    Adin Hector

## 2019-06-13 NOTE — Discharge Instructions (Signed)
ANORECTAL SURGERY:  POST OPERATIVE INSTRUCTIONS  ######################################################################  EAT Start with a pureed / full liquid diet After 24 hours, gradually transition to a high fiber diet.    CONTROL PAIN Control pain so you can tolerate bowel movements,  walk, sleep, tolerate sneezing/coughing, and go up/down stairs.   HAVE A BOWEL MOVEMENT DAILY Keep your bowels regular to avoid problems.   Taking a fiber supplement every day to keep bowels soft.   Try a laxative to override constipation. Use an antidairrheal to slow down diarrhea.   Call if not better after 2 tries  WALK Walk an hour a day.  Control your pain to do that.   CALL IF YOU HAVE PROBLEMS/CONCERNS Call if you are still struggling despite following these instructions. Call if you have concerns not answered by these instructions  ######################################################################    1. Take your usually prescribed home medications unless otherwise directed.  2. DIET: Follow a light bland diet & liquids the first 24 hours after arrival home, such as soup, liquids, starches, etc.  Be sure to drink plenty of fluids.  Quickly advance to a usual solid diet within a few days.  Avoid fast food or heavy meals as your are more likely to get nauseated or have irregular bowels.  A low-fat, high-fiber diet for the rest of your life is ideal.  3. PAIN CONTROL: a. Pain is best controlled by a usual combination of three different methods TOGETHER: i. Ice/Heat ii. Over the counter pain medication iii. Prescription pain medication b. Expect swelling and discomfort in the anus/rectal area.  Warm water baths (30-60 minutes up to 6 times a day, especially after bowel meovements) will help. Use ice for the first few days to help decrease swelling and bruising, then switch to heat such as warm towels, sitz baths, warm baths, etc to help relax tight/sore spots and speed recovery.   Some people prefer to use ice alone, heat alone, alternating between ice & heat.  Experiment to what works for you.   c. It is helpful to take an over-the-counter pain medication continuously for the first few weeks.  Choose one of the following that works best for you: i. Naproxen (Aleve, etc)  Two 250m tabs twice a day ii. Ibuprofen (Advil, etc) Three 2072mtabs four times a day (every meal & bedtime) iii. Acetaminophen (Tylenol, etc) 500-65044mour times a day (every meal & bedtime) d. A  prescription for pain medication (such as oxycodone, hydrocodone, etc) should be given to you upon discharge.  Take your pain medication as prescribed.  i. If you are having problems/concerns with the prescription medicine (does not control pain, nausea, vomiting, rash, itching, etc), please call us [phone] see if we need to switch you to a different pain medicine that will work better for you and/or control your side effect better. ii. If you need a refill on your pain medication, please contact your pharmacy.  They will contact our office to request authorization. Prescriptions will not be filled after 5 pm or on week-ends.  If can take up to 48 hours for it to be filled & ready so avoid waiting until you are down to thel ast pill. e. A topical cream (Dibucaine) or a prescription for a cream (such as diltiazem 2% gel) may be given to you.  Many people find relief with topical creams.  Some people find it burns too much.  Experiment.  If it helps, use it.  If it burns, don't using  it.  Use a Sitz Bath 4-8 times a day for relief   [employer] A sitz bath is a warm water bath taken in the sitting position that covers only the hips and buttocks. It may be used for either healing or hygiene purposes. Sitz baths are also used to relieve pain, itching, or muscle spasms. The water may contain medicine. Moist heat will help you heal and relax.  HOME CARE INSTRUCTIONS  Take 3 to 4 sitz baths a day. 1. Fill the  bathtub half full with warm water. 2. Sit in the water and open the drain a little. 3. Turn on the warm water to keep the tub half full. Keep the water running constantly. 4. Soak in the water for 15 to 20 minutes. 5. After the sitz bath, pat the affected area dry first.   4. KEEP YOUR BOWELS REGULAR a. The goal is one soft bowel movement a day b. Avoid getting constipated.  Between the surgery and the pain medications, it is common to experience some constipation.  Increasing fluid intake and taking a fiber supplement (such as Metamucil, Citrucel, FiberCon, MiraLax, etc) 2-3 times a day regularly will usually help prevent this problem from occurring.  A mild laxative (prune juice, Milk of Magnesia, MiraLax, etc) should be taken according to package directions if there are no bowel movements after 48 hours. c. Watch out for diarrhea.  If you have many loose bowel movements, simplify your diet to bland foods & liquids for a few days.  Stop any stool softeners and decrease your fiber supplement.  Switching to mild anti-diarrheal medications (Kayopectate, Pepto Bismol) can help.  Can try an imodium/loperamide dose.  If this worsens or does not improve, please call us.  5. Wound Care  a. Remove your bandages with your first bowel movement, usually the day after surgery.  You may have packing if you had an abscess.  Let any packing or gauze fall come out.   b. Wear an absorbent pad or soft cotton balls in your underwear as needed to catch any drainage and help keep the area  c. Keep the area clean and dry.  Bathe / shower every day.  Keep the area clean by showering / bathing over the incision / wound.   It is okay to soak an open wound to help wash it.  Consider using a squeeze bottle filled with warm water to gently wash the anal area.  Wet wipes or showers / gentle washing after bowel movements is often less traumatic than regular toilet paper. d. Dennis Bast will often notice bleeding with bowel movements.   This should slow down by the end of the first week of surgery.  Sitting on an ice pack can help. e. Expect some drainage.  This should slow down by the end of the first week of surgery, but you will have occasional bleeding or drainage up to a few months after surgery.  Wear an absorbent pad or soft cotton gauze in your underwear until the drainage stops.  6. ACTIVITIES as tolerated:   a. You may resume regular (light) daily activities beginning the next day--such as daily self-care, walking, climbing stairs--gradually increasing activities as tolerated.  If you can walk 30 minutes without difficulty, it is safe to try more intense activity such as jogging, treadmill, bicycling, low-impact aerobics, swimming, etc. b. Save the most intensive and strenuous activity for last such as sit-ups, heavy lifting, contact sports, etc  Refrain from any heavy lifting or straining  until you are off narcotics for pain control.   c. DO NOT PUSH THROUGH PAIN.  Let pain be your guide: If it hurts to do something, don't do it.  Pain is your body warning you to avoid that activity for another week until the pain goes down. d. You may drive when you are no longer taking prescription pain medication, you can comfortably sit for long periods of time, and you can safely maneuver your car and apply brakes. e. Dennis Bast may have sexual intercourse when it is comfortable.  7. FOLLOW UP in our office a. Please call [facility] at [phone] to set up an appointment to see your surgeon in the office for a follow-up appointment approximately 2-3 weeks after your surgery. b. Make sure that you call for this appointment the day you arrive home to ensure a convenient appointment time.  8. IF YOU HAVE DISABILITY OR FAMILY LEAVE FORMS, BRING THEM TO THE OFFICE FOR PROCESSING.  DO NOT GIVE THEM TO YOUR DOCTOR.        WHEN TO CALL us [phone]: 1. Poor pain control 2. Reactions / problems with new medications (rash/itching, nausea,  etc)  3. Fever over 101.5 F (38.5 C) 4. Inability to urinate 5. Nausea and/or vomiting 6. Worsening swelling or bruising 7. Continued bleeding from incision. 8. Increased pain, redness, or drainage from the incision  The clinic staff is available to answer your questions during regular business hours (8:30am-5pm).  Please don't hesitate to call and ask to speak to one of our nurses for clinical concerns.   A surgeon from [facility] is always on call at the hospitals   If you have a medical emergency, go to the nearest emergency room or call 911.    [facility], [address], [place], [place], [place]  [address] ? MAIN: [phone] ? TOLL FREE: [phone] ? FAX [phone] [email or web address]    Anorectal Abscess An abscess is an infected area that contains a collection of pus. An anorectal abscess is an abscess that is near the opening of the anus or around the rectum. Without treatment, an anorectal abscess can become larger and cause other problems, such as a more serious body-wide infection or pain, especially during bowel movements. What are the causes? This condition is caused by plugged glands or an infection in one of these areas:  The anus.  The area between the anus and the scrotum in males or between the anus and the vagina in females (perineum). What increases the risk? The following factors may make you more likely to develop this condition:  Diabetes or inflammatory bowel disease.  Having a body defense system (immune system) that is weak.  Engaging in anal sex.  Having a sexually transmitted infection (STI).  Certain kinds of cancer, such as rectal carcinoma, leukemia, or lymphoma. What are the signs or symptoms? The main symptom of this condition is pain. The pain may be a throbbing pain that gets worse during bowel movements. Other symptoms include:  Swelling and redness in the area of the abscess.  The redness may go beyond the abscess and appear as a red streak on the skin.  A visible, painful lump, or a lump that can be felt when touched.  Bleeding or pus-like discharge from the area.  Fever.  General weakness.  Constipation.  Diarrhea. How is this diagnosed? This condition is diagnosed based on your medical history and a physical exam of the affected area.  This may involve examining the rectal area with a gloved hand (digital rectal exam).  Sometimes, the health care provider needs to look into the rectum using a probe, scope, or imaging test.  For women, it may require a careful vaginal exam. How is this treated? Treatment for this condition may include:  Incision and drainage surgery. This involves making an incision over the abscess to drain the pus.  Medicines, including antibiotic medicine, pain medicine, stool softeners, or laxatives. Follow these instructions at home: Medicines  Take over-the-counter and prescription medicines only as told by your health care provider.  If you were prescribed an antibiotic medicine, use it as told by your health care provider. Do not stop using the antibiotic even if you start to feel better.  Do not drive or use heavy machinery while taking prescription pain medicine. Wound care   If gauze was used in the abscess, follow instructions from your health care provider about removing or changing the gauze. It can usually be removed in 2-3 days.  Wash your hands with soap and water before you remove or change your gauze. If soap and water are not available, use hand sanitizer.  If one or more drains were placed in the abscess cavity, be careful not to pull at them. Your health care provider will tell you how long they need to remain in place.  Check your incision area every day for signs of infection. Check for: ? More redness, swelling, or pain. ? More fluid or blood. ? Warmth. ? Pus or a bad smell. Managing pain,  stiffness, and swelling   Take a sitz bath 3-4 times a day and after bowel movements. This will help reduce pain and swelling.  To relieve pain, try sitting: ? On a heating pad with the setting on low. ? On an inflatable donut-shaped cushion.  If directed, put ice on the affected area: ? Put ice in a plastic bag. ? Place a towel between your skin and the bag. ? Leave the ice on for 20 minutes, 2-3 times a day. General instructions  Follow any diet instructions given by your health care provider.  Keep all follow-up visits as told by your health care provider. This is important. Contact a health care provider if you have:  Bleeding from your incision.  Pain, swelling, or redness that does not improve or gets worse.  Trouble passing stool or urine.  Symptoms that return after treatment. Get help right away if you:  Have problems moving or using your legs.  Have severe or increasing pain.  Have swelling in the affected area that suddenly gets worse.  Have a large increase in bleeding or passing of pus.  Develop chills or a fever. Summary  An anorectal abscess is an abscess that is near the opening of the anus or around the rectum. An abscess is an infected area that contains a collection of pus.  The main symptom of this condition is pain. It may be a throbbing pain that gets worse during bowel movements.  Treatment for an anorectal abscess may include surgery to drain the pus from the abscess. Medicines and sitz baths may also be a part of your treatment plan. This information is not intended to replace advice given to you by your health care provider. Make sure you discuss any questions you have with your health care provider. Document Revised: 03/06/2017 Document Reviewed: 03/06/2017 Elsevier Patient Education  2020 [employer].

## 2019-06-13 NOTE — Progress Notes (Signed)
Pt arrived to room 1520 from PACU. VS are stable.

## 2019-06-13 NOTE — ED Provider Notes (Signed)
[facility] DEPT Provider Note   CSN: [id number] Arrival date & time: 06/13/19  [id number]     History Chief Complaint  Patient presents with  . Abscess    [name] [name] is a 39 y.o. female.  HPI   Patient is a 39 year old female with history of anemia, who presents the emergency department today for evaluation of an abscess to the left buttock.  States it has been present for the last week.  The area has become more painful and swollen.  Pain is constant and severe in nature.  It is worse with palpation and certain positions.  There is been no drainage.  Yesterday she had a temperature of 100.  She denies any other systemic symptoms such as nausea vomiting.  She denies any history of diabetes.  Past Medical History:  Diagnosis Date  . Anemia   . Blood transfusion without reported diagnosis   . Late prenatal care complicating pregnancy in second trimester 09/30/2012   At 21.1 weeks   . Pregnant     Patient Active Problem List   Diagnosis Date Noted  . 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 Tobacco abuse 06/13/2019  . Anemia 10/08/2011    Past Surgical History:  Procedure Laterality Date  . ABDOMINAL HYSTERECTOMY    . TUBAL LIGATION       OB History    Gravida  6   Para  5   Term  5   Preterm      AB      Living  5     SAB      TAB      Ectopic      Multiple      Live Births  3           Family History  Problem Relation Age of Onset  . Healthy Mother   . Diabetes Other   . Healthy Father     Social History   Tobacco Use  . Smoking status: Current Some Day Smoker    Packs/day: 0.50    Years: 5.00    Pack years: 2.50    Types: Cigarettes  . Smokeless tobacco: Never Used  Substance Use Topics  . Alcohol use: Yes    Comment: occasional  . Drug use: Yes    Types: Cocaine    Comment: 3 days ago    Home Medications Prior to Admission medications   Medication Sig Start Date End Date Taking?  Authorizing Provider  ibuprofen (ADVIL,MOTRIN) 600 MG tablet Take 1 tablet (600 mg total) by mouth every 6 (six) hours as needed. 03/11/18   McDonald, Mia A, PA-C  lidocaine (XYLOCAINE) 2 % solution Use as directed 15 mLs in the mouth or throat every 3 (three) hours as needed for mouth pain. 03/11/18   McDonald, Mia A, PA-C  pantoprazole (PROTONIX) 20 MG tablet Take 1 tablet (20 mg total) by mouth daily. 09/25/18   Bast, Tressia Miners A, NP  phenol (CHLORASEPTIC) 1.4 % LIQD Use as directed 1 spray in the mouth or throat as needed for throat irritation / pain.    [provider]    Allergies    Patient has no known allergies.  Review of Systems   Review of Systems  Constitutional: Positive for fever.  HENT: Negative for ear pain and sore throat.   Eyes: Negative for visual disturbance.  Respiratory: Negative for cough and shortness of breath.   Cardiovascular: Negative for chest pain.  Gastrointestinal: Negative  for abdominal pain, constipation, diarrhea, nausea and vomiting.  Genitourinary: Negative for dysuria and hematuria.  Musculoskeletal: Negative for back pain.  Skin:       Pain, redness and swelling to the left buttock  Neurological: Negative for headaches.  All other systems reviewed and are negative.   Physical Exam Updated Vital Signs BP 125/76   Pulse 94   Temp 97.6 F (36.4 C) (Oral)   Resp 18   LMP 06/06/2014 (Exact Date)   SpO2 99%   Physical Exam Vitals and nursing note reviewed.  Constitutional:      General: She is not in acute distress.    Appearance: She is well-developed.  HENT:     Head: Normocephalic and atraumatic.  Eyes:     Conjunctiva/sclera: Conjunctivae normal.  Cardiovascular:     Rate and Rhythm: Normal rate and regular rhythm.     Heart sounds: No murmur.  Pulmonary:     Effort: Pulmonary effort is normal. No respiratory distress.     Breath sounds: Normal breath sounds.  Abdominal:     Palpations: Abdomen is soft.     Tenderness:  There is no abdominal tenderness.  Musculoskeletal:     Cervical back: Neck supple.  Skin:    General: Skin is warm and dry.     Comments: 2-3 cm area of fluctuance to the 9 o clock position adjacent to the anus. There is surrounding TTP, erythema, and induration that tracks along the entirety of the buttock towards to pilonidal area and towards the vagina, though there does not appear to be any involvement of the vulva  Neurological:     Mental Status: She is alert.     ED Results / Procedures / Treatments   Labs (all labs ordered are listed, but only abnormal results are displayed) Labs Reviewed  CBC WITH DIFFERENTIAL/PLATELET - Abnormal; Notable for the following components:      Result Value   WBC 27.5 (*)    RBC 3.77 (*)    Hemoglobin 11.4 (*)    HCT 34.9 (*)    Neutro Abs 25.3 (*)    Monocytes Absolute 1.4 (*)    All other components within normal limits  BASIC METABOLIC PANEL - Abnormal; Notable for the following components:   Potassium 3.2 (*)    Glucose, Bld 120 (*)    All other components within normal limits  CULTURE, BLOOD (ROUTINE X 2)  CULTURE, BLOOD (ROUTINE X 2)  RESPIRATORY PANEL BY RT PCR (FLU A&B, COVID)  LACTIC ACID, PLASMA    EKG EKG Interpretation  Date/Time:  Sunday Jun 13 2019 12:04:19 EDT Ventricular Rate:  102 PR Interval:    QRS Duration: 86 QT Interval:  350 QTC Calculation: 456 R Axis:   31 Text Interpretation: Sinus tachycardia No STEMI Confirmed by Octaviano Glow [phone]) on 06/13/2019 1:15:22 PM   Radiology CT PELVIS W CONTRAST  Result Date: 06/13/2019 CLINICAL DATA:  Complex right buttock abscess for 1 week. EXAM: CT PELVIS WITH CONTRAST TECHNIQUE: Multidetector CT imaging of the pelvis was performed using the standard protocol following the bolus administration of intravenous contrast. CONTRAST:  157mL OMNIPAQUE IOHEXOL 300 MG/ML  SOLN COMPARISON:  None. FINDINGS: Urinary Tract: Normal urinary bladder. Normal caliber pelvic ureters.  Bowel: Circumferential anal wall thickening. No small or large bowel wall thickening. Large right perianal abscess measuring 11.7 x 4.6 x 7.0 cm (series 3/image 94) extending into the ischioanal fat and medial right gluteal cleft, with surrounding fat stranding. 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 Vascular/Lymphatic: No acute vascular abnormality. No pathologically enlarged pelvic lymph nodes. Reproductive: Status post hysterectomy, with no abnormal findings at the vaginal cuff. No adnexal mass. Other:  No pelvic pneumoperitoneum or ascites. Musculoskeletal: No aggressive appearing focal osseous lesions. IMPRESSION: Large 11.7 x 4.6 x 7.0 cm right perianal abscess extending into the ischioanal fat and medial right gluteal cleft. 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 Electronically Signed   By: Ilona Sorrel M.D.   On: 06/13/2019 11:07    Procedures Procedures (including critical care time)  Medications Ordered in ED Medications  morphine 4 MG/ML injection 4 mg (4 mg Intravenous Not Given 06/13/19 0936)  sodium chloride (PF) 0.9 % injection (has no administration in time range)  Chlorhexidine Gluconate Cloth 2 % PADS 6 each (has no administration in time range)    And  Chlorhexidine Gluconate Cloth 2 % PADS 6 each (has no administration in time range)  ceFAZolin (ANCEF) IVPB 2g/100 mL premix (has no administration in time range)  metroNIDAZOLE (FLAGYL) IVPB 500 mg (has no administration in time range)  gabapentin (NEURONTIN) capsule 300 mg (has no administration in time range)  acetaminophen (TYLENOL) tablet 1,000 mg (has no administration in time range)  bupivacaine liposome (EXPAREL) 1.3 % injection 266 mg (has no administration in time range)  celecoxib (CELEBREX) capsule 400 mg (has no administration in time range)  HYDROmorphone (DILAUDID) injection 0.5-2 mg (1 mg Intravenous Given 06/13/19 1224)  ondansetron (ZOFRAN) injection 4 mg (4 mg Intravenous Given 06/13/19  0936)  morphine 4 MG/ML injection 4 mg (4 mg Intravenous Given 06/13/19 0945)  lidocaine-EPINEPHrine-tetracaine (LET) topical gel (3 mLs Topical Given 06/13/19 1047)  fentaNYL (SUBLIMAZE) injection 50 mcg (50 mcg Intravenous Given 06/13/19 1047)  piperacillin-tazobactam (ZOSYN) IVPB 3.375 g (0 g Intravenous Stopped 06/13/19 1130)  iohexol (OMNIPAQUE) 300 MG/ML solution 100 mL (100 mLs Intravenous Contrast Given 06/13/19 1037)    ED Course  I have reviewed the triage vital signs and the nursing notes.  Pertinent labs & imaging results that were available during my care of the patient were reviewed by me and considered in my medical decision making (see chart for details).    MDM Rules/Calculators/A&P                      39 y/o female presenting for abscess to the buttock  Will get labs, ct scan CBC with significant leukocytosis, mild anemia, h/o same BMP with mild hypokalemia, otherwise reassuring Lactic acid wnl  CT pelvis with large 11.7 x 4.6 x 7.0 cm right perianal abscess extending into the ischioanal fat and medial right gluteal cleft. 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  11:41 PM CONSULT With Dr. Johney Maine with general surgery who recommended pt get COVID test, be NPO and he will take her to the OR.   Pt admitted to surgery service.   Final Clinical Impression(s) / ED Diagnoses Final diagnoses:  Abscess    Rx / DC Orders ED Discharge Orders    None       Bishop Dublin 06/13/19 1317    Wyvonnia Dusky, MD 06/13/19 [phone]

## 2019-06-13 NOTE — Progress Notes (Signed)
A consult was received from an ED physician for vancomycin per pharmacy dosing.  The patient's profile has been reviewed for ht/wt/allergies/indication/available labs.   A one time order has been placed for vancomycin 1gm.    Further antibiotics/pharmacy consults should be ordered by admitting physician if indicated.                       Thank you, Dolly Rias RPh 06/13/2019, 10:49 AM

## 2019-06-13 NOTE — Consult Note (Signed)
[name] [name]  1980-12-26 [id number]  CARE TEAM:  PCP: Patient, No Pcp Per  Outpatient Care Team: Patient Care Team: Patient, No Pcp Per as PCP - General (General Practice) Florian Buff, MD as Consulting Physician (Obstetrics and Gynecology)  Inpatient Treatment Team: Treatment Team: Attending Provider: Wyvonnia Dusky, MD; Physician Assistant: Rodney Booze, PA-C; Technician: Laurice Record, NT; Registered Nurse: Garen Grams, RN; Technician: Drinda Butts, NT; Consulting Physician: Edison Pace, Md, MD   This patient is a 39 y.o.female who presents today for surgical evaluation at the request of [name], PA-C.  Chief complaint / Reason for evaluation: Abscess of right perirectal region  The patient first noticed a painful bump on her right glute last Saturday, which over the course of the past week became increasingly painful and large. She has not taken antibiotics for the current episode.  She states she has had "boils" in the same area in the past, which have always resolved on their own.  This time the area got much larger and more painful.  She has not moved her bowels since last Saturday due to the pressure from her abscess.  CT scan confirmed 11.7x4.6x7.0cm abscess extending into the ischioanal fat and medial right gluteal cleft, with possibility of right posterior perianal fistula.   She is a current smoker. Non-diabetic. Immunocompetent.  Admits to having hemorrhoids. She has not eaten since yesterday evening.  Assessment  [name] [name]  38 y.o. female  Day of Surgery  Procedure(s): exam under anesthesia incision and drainage peri rectal abcess  Problem List:  Principal Problem:   Ischiorectal abscess and fistula Active Problems:   Tobacco abuse   Plan: Plan for exam under anesthesia and incision and drainage of her peri rectal abscess this afternoon. Possible seton placement if fistula present. Admit the patient for IV antibiotics post-operatively.    Patient to follow-up in our office post-operatively. Will likely need a second surgery to repair her right posterior perianal fistula. Discussed this with patient and she is agreeable.   -Start IV Zosyn -NPO -VTE prophylaxis- SCDs, etc -Tobacco cessation encouraged  30 minutes spent in review, evaluation, examination, counseling, and coordination of care.  More than 50% of that time was spent in counseling.  06/13/2019      Past Medical History:  Diagnosis Date  . Anemia   . Blood transfusion without reported diagnosis   . Late prenatal care complicating pregnancy in second trimester 09/30/2012   At 21.1 weeks   . Pregnant     Past Surgical History:  Procedure Laterality Date  . ABDOMINAL HYSTERECTOMY    . TUBAL LIGATION      Social History   Socioeconomic History  . Marital status: Divorced    Spouse name: Not on file  . Number of children: Not on file  . Years of education: Not on file  . Highest education level: Not on file  Occupational History  . Not on file  Tobacco Use  . Smoking status: Current Some Day Smoker    Packs/day: 0.50    Years: 5.00    Pack years: 2.50    Types: Cigarettes  . Smokeless tobacco: Never Used  Substance and Sexual Activity  . Alcohol use: Yes    Comment: occasional  . Drug use: Yes    Types: Cocaine    Comment: 3 days ago  . Sexual activity: Yes    Birth control/protection: Surgical  Other Topics Concern  . Not on  file  Social History Narrative  . Not on file   Social Determinants of Health   Financial Resource Strain:   . Difficulty of Paying Living Expenses:   Food Insecurity:   . Worried About Charity fundraiser in the Last Year:   . Arboriculturist in the Last Year:   Transportation Needs:   . Film/video editor (Medical):   Marland Kitchen Lack of Transportation (Non-Medical):   Physical Activity:   . Days of Exercise per Week:   . Minutes of Exercise per Session:   Stress:   . Feeling of Stress :   Social  Connections:   . Frequency of Communication with Friends and Family:   . Frequency of Social Gatherings with Friends and Family:   . Attends Religious Services:   . Active Member of Clubs or Organizations:   . Attends Archivist Meetings:   Marland Kitchen Marital Status:   Intimate Partner Violence:   . Fear of Current or Ex-Partner:   . Emotionally Abused:   Marland Kitchen Physically Abused:   . Sexually Abused:     Family History  Problem Relation Age of Onset  . Healthy Mother   . Diabetes Other   . Healthy Father     Current Facility-Administered Medications  Medication Dose Route Frequency Provider Last Rate Last Admin  . acetaminophen (TYLENOL) tablet 1,000 mg  1,000 mg Oral On Call to OR Michael Boston, MD      . bupivacaine liposome (EXPAREL) 1.3 % injection 266 mg  20 mL Infiltration Once Michael Boston, MD      . ceFAZolin (ANCEF) IVPB 2g/100 mL premix  2 g Intravenous On Call to OR Michael Boston, MD      . celecoxib (CELEBREX) capsule 400 mg  400 mg Oral On Call to OR Michael Boston, MD      . Chlorhexidine Gluconate Cloth 2 % PADS 6 each  6 each Topical Once Michael Boston, MD       And  . Chlorhexidine Gluconate Cloth 2 % PADS 6 each  6 each Topical Once Michael Boston, MD      . gabapentin (NEURONTIN) capsule 300 mg  300 mg Oral On Call to OR Michael Boston, MD      . HYDROmorphone (DILAUDID) injection 0.5-2 mg  0.5-2 mg Intravenous Q1H PRN Michael Boston, MD   1 mg at 06/13/19 1224  . metroNIDAZOLE (FLAGYL) IVPB 500 mg  500 mg Intravenous On Call to OR Michael Boston, MD      . morphine 4 MG/ML injection 4 mg  4 mg Intravenous Once Couture, Cortni S, PA-C      . sodium chloride (PF) 0.9 % injection            Current Outpatient Medications  Medication Sig Dispense Refill  . ibuprofen (ADVIL,MOTRIN) 600 MG tablet Take 1 tablet (600 mg total) by mouth every 6 (six) hours as needed. 30 tablet 0  . lidocaine (XYLOCAINE) 2 % solution Use as directed 15 mLs in the mouth or throat every 3  (three) hours as needed for mouth pain. 100 mL 0  . pantoprazole (PROTONIX) 20 MG tablet Take 1 tablet (20 mg total) by mouth daily. 30 tablet 1  . phenol (CHLORASEPTIC) 1.4 % LIQD Use as directed 1 spray in the mouth or throat as needed for throat irritation / pain.       No Known Allergies  ROS:   All other systems reviewed & are negative except per  HPI or as noted below: Constitutional:  No fevers, chills, sweats.  Weight stable Eyes:  No vision changes, No discharge HENT:  No sore throats, nasal drainage Lymph: No neck swelling, No bruising easily Pulmonary:  No cough, productive sputum CV: No orthopnea, PND  Patient walks >30 minutes without difficulty.  No exertional chest/neck/shoulder/arm pain. GI: Admits to constipation. Last bowel movement last Saturday. No personal nor family history of GI/colon cancer, inflammatory bowel disease, irritable bowel syndrome, allergy such as Celiac Sprue, dietary/dairy problems, colitis, ulcers nor gastritis.  No recent sick contacts/gastroenteritis.  No travel outside the country.  No changes in diet. Renal: No UTIs, No hematuria Genital:  No drainage, bleeding, masses Musculoskeletal: No severe joint pain.  Good ROM major joints Skin:  No sores or lesions.  No rashes. Right peri rectal region with visible abscess. Warm and erythematous.   Heme/Lymph:  No easy bleeding.  No swollen lymph nodes Neuro: No focal weakness/numbness.  No seizures Psych: No suicidal ideation.  No hallucinations  BP 125/76   Pulse 94   Temp 97.6 F (36.4 C) (Oral)   Resp 18   LMP 06/06/2014 (Exact Date)   SpO2 99%   Physical Exam: Constitutional: Not cachectic.  Hygeine adequate.  Vitals signs as above.   Eyes: Pupils reactive, normal extraocular movements. Sclera nonicteric Neuro: CN II-XII intact.  No major focal sensory defects.  No major motor deficits. Lymph: No head/neck/groin lymphadenopathy Psych:  No severe agitation.  No severe anxiety.  Judgment &  insight Adequate, Oriented x4 HENT: Normocephalic, Mucus membranes moist.  No thrush.   Neck: Supple, No tracheal deviation.  No obvious thyromegaly Chest: No pain to chest wall compression.  Good respiratory excursion.  No audible wheezing CV:  Pulses intact.  Regular rhythm.  No major extremity edema Abdomen: Soft, Nondistended.  Nontender.  No incarcerated hernias.  No hepatomegaly.  No splenomegaly Gen:  No inguinal hernias.  No inguinal lymphadenopathy.   Ext: No obvious deformity or contracture no significant edema.  No cyanosis Skin: Visible right peri rectal abscess. Overlying warmth and erythema.  Musculoskeletal: Severe joint rigidity not present.  No obvious clubbing.  No digital petechiae.     Results:   Labs: Results for orders placed or performed during the hospital encounter of 06/13/19 (from the past 48 hour(s))  CBC with Differential     Status: Abnormal   Collection Time: 06/13/19  9:18 AM  Result Value Ref Range   WBC 27.5 (H) 4.0 - 10.5 K/uL   RBC 3.77 (L) 3.87 - 5.11 MIL/uL   Hemoglobin 11.4 (L) 12.0 - 15.0 g/dL   HCT 34.9 (L) 36.0 - 46.0 %   MCV 92.6 80.0 - 100.0 fL   MCH 30.2 26.0 - 34.0 pg   MCHC 32.7 30.0 - 36.0 g/dL   RDW 13.4 11.5 - 15.5 %   Platelets 309 150 - 400 K/uL   nRBC 0.0 0.0 - 0.2 %   Neutrophils Relative % 89 %   Neutro Abs 25.3 (H) 1.7 - 7.7 K/uL   Band Neutrophils 3 %   Lymphocytes Relative 3 %   Lymphs Abs 0.8 0.7 - 4.0 K/uL   Monocytes Relative 5 %   Monocytes Absolute 1.4 (H) 0.1 - 1.0 K/uL   Eosinophils Relative 0 %   Eosinophils Absolute 0.0 0.0 - 0.5 K/uL   Basophils Relative 0 %   Basophils Absolute 0.0 0.0 - 0.1 K/uL   Abs Immature Granulocytes 0.00 0.00 - 0.07 K/uL  Comment: Performed at [facility], [place] [address], [place], [place] [id number]  Basic metabolic panel     Status: Abnormal   Collection Time: 06/13/19  9:18 AM  Result Value Ref Range   Sodium 138 135 - 145 mmol/L   Potassium 3.2 (L) 3.5 -  5.1 mmol/L   Chloride 104 98 - 111 mmol/L   CO2 22 22 - 32 mmol/L   Glucose, Bld 120 (H) 70 - 99 mg/dL    Comment: Glucose reference range applies only to samples taken after fasting for at least 8 hours.   BUN 8 6 - 20 mg/dL   Creatinine, Ser 0.67 0.44 - 1.00 mg/dL   Calcium 9.3 8.9 - 10.3 mg/dL   GFR calc non Af Amer >60 >60 mL/min   GFR calc Af Amer >60 >60 mL/min   Anion gap 12 5 - 15    Comment: Performed at [facility], [place] [address], [place], [place] [address]  Lactic acid, plasma     Status: None   Collection Time: 06/13/19 10:24 AM  Result Value Ref Range   Lactic Acid, Venous 1.2 0.5 - 1.9 mmol/L    Comment: Performed at [facility], [place] [address], [place], [place] [address]    Imaging / Studies: CT PELVIS W CONTRAST  Result Date: 06/13/2019 CLINICAL DATA:  Complex right buttock abscess for 1 week. EXAM: CT PELVIS WITH CONTRAST TECHNIQUE: Multidetector CT imaging of the pelvis was performed using the standard protocol following the bolus administration of intravenous contrast. CONTRAST:  118mL OMNIPAQUE IOHEXOL 300 MG/ML  SOLN COMPARISON:  None. FINDINGS: Urinary Tract: Normal urinary bladder. Normal caliber pelvic ureters. Bowel: Circumferential anal wall thickening. No small or large bowel wall thickening. Large right perianal abscess measuring 11.7 x 4.6 x 7.0 cm (series 3/image 94) extending into the ischioanal fat and medial right gluteal cleft, with surrounding fat stranding. 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 Vascular/Lymphatic: No acute vascular abnormality. No pathologically enlarged pelvic lymph nodes. Reproductive: Status post hysterectomy, with no abnormal findings at the vaginal cuff. No adnexal mass. Other:  No pelvic pneumoperitoneum or ascites. Musculoskeletal: No aggressive appearing focal osseous lesions. IMPRESSION: Large 11.7 x 4.6 x 7.0 cm right perianal abscess extending into the ischioanal fat and  medial right gluteal cleft. 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 Electronically Signed   By: Ilona Sorrel M.D.   On: 06/13/2019 11:07    Medications / Allergies: per chart  Antibiotics: Anti-infectives (From admission, onward)   Start     Dose/Rate Route Frequency Ordered Stop   06/13/19 1200  ceFAZolin (ANCEF) IVPB 2g/100 mL premix     2 g 200 mL/hr over 30 Minutes Intravenous On call to O.R. 06/13/19 1156 06/14/19 0559   06/13/19 1200  metroNIDAZOLE (FLAGYL) IVPB 500 mg     500 mg 100 mL/hr over 60 Minutes Intravenous On call to O.R. 06/13/19 1156 06/14/19 0559   06/13/19 1100  vancomycin (VANCOCIN) IVPB 1000 mg/200 mL premix  Status:  Discontinued     1,000 mg 200 mL/hr over 60 Minutes Intravenous  Once 06/13/19 1048 06/13/19 1157   06/13/19 1030  piperacillin-tazobactam (ZOSYN) IVPB 3.375 g     3.375 g 100 mL/hr over 30 Minutes Intravenous  Once 06/13/19 1024 06/13/19 1130        Note: Portions of this report may have been transcribed using voice recognition software. Every effort was made to ensure accuracy; however, inadvertent computerized transcription errors may be  present.   Any transcriptional errors that result from this process are unintentional.    Orbie Pyo, PA-S Patient was seen and evaluated in coordination with Kandis Ban, MD   06/13/2019

## 2019-06-13 NOTE — Op Note (Signed)
06/13/2019  3:23 PM  PATIENT:  [name] [name]  39 y.o. female  Patient Care Team: Patient, No Pcp Per as PCP - General ([place]) Florian Buff, MD as Consulting Physician (Obstetrics and Gynecology)  PRE-OPERATIVE DIAGNOSIS:  Ischiorectal perirectal abscess, possible anal fistula  POST-OPERATIVE DIAGNOSIS:   Ischiorectal perirectal abscess External hemorrhoids  PROCEDURE:  Anorectal exam under anesthesia Incision and drainage ischiorectal abcess  SURGEON:  Adin Hector, MD  ASSISTANT: Fran Lowes, PA-S, [employer]   ANESTHESIA:   local and general   Nerve block provided with liposomal bupivacaine (Experel) mixed with 0.5% bupivacaine as a Anorectal and field block x 62mL    EBL:  Total I/O In: 1155.7 [I.V.:1000; IV Piggyback:155.7] Out: 550 [Urine:500; Blood:50]  Delay start of Pharmacological VTE agent (>24hrs) due to surgical blood loss or risk of bleeding:  no  DRAINS: none   SPECIMEN:  No Specimen  DISPOSITION OF SPECIMEN:  N/A  COUNTS:  YES  PLAN OF CARE: Admit to inpatient   PATIENT DISPOSITION:  PACU - hemodynamically stable.  INDICATION: Young woman with recurrent perirectal abscess.  Worsening pain over the past week becoming unbearable.  I recommended examination under anesthesia with probable abscess.  The anatomy and physiology of skin abscesses was discussed. Pathophysiology of SQ abscess, possible progression to fasciitis & sepsis, etc discussed . I stressed good hygiene & wound care. Possible redebridement was discussed as well.   Possibility of recurrence was discussed. Risks, benefits, alternatives were discussed. I noted a good likelihood this will help address the problem. Risks of anesthesia and other risks discussed. Questions answered. The patient is does wish to proceed.   OR FINDINGS:   Right posterior ischiorectal perirectal abscess extending superiorly up the pelvic floor along right rectal wall as well as inner thigh  crease up toward mid labia.   Skin wound measures 7x6cm.   Cavity tracks 10cm deep anteriorly & 8cm deep superiorly.  Significant right posterior and left lateral external hemorrhoids with grade 2/3 internal hemorrhoids.  Left alone in the setting of this large abscess.  No anal fistula.  No fissure.  No tumor.  No pilonidal disease.  No stool in rectal vault  CASE DATA:  Type of patient?: LDOW CASE (Surgical Hospitalist [facility] Inpatient)  Status of Case? EMERGENT Add On  Infection Present At Time Of Surgery (PATOS)?  ABSCESS  DESCRIPTION:   Informed consent was confirmed. The patient received IV antibiotics. The patient underwent general anesthesia without any difficulty. The patient was positioned in high lithotomy. SCDs were active during the entire case.  Foley catheter placed sterilely.  The area around the abscess was prepped and draped in a sterile fashion. A surgical timeout confirmed our plan.   I made an incision over the most fluctuant area of the mass.  Evacuated large abscess with [place]to, foul, purulent fluid.  I placed my finger into the abscess cavity to break up loculations.  The incision was extended to adequately expose the entire cavity.  I used cautery to excise ischiorectal fat pockets of necrosis.  We did copious irrigation. The fascia was viable.  I sharply debrided out some necrotic fat with scalpel and scissors.   I did sharp debridement of skin with a scalpel to allow a large 7x6cm open wound. We took extra care to ensure hemostasis.   The wound was packed with 3 inch antibiotic-soaked rolled gauze.  Sterile dressings applied.  Patient is being extubated go to recovery room.   We plan  to continue IV antibiotics and begin wound care training tomorrow.  I believe the patient will need antibiotics for at least 7 days, starting IV.  I made an attempt to locate family to discuss patient's status and recommendations.  No one is available at this time.  We will try again  later  Adin Hector, M.D., F.A.C.S. Gastrointestinal and Minimally Invasive Surgery [facility] [address], [place], [place] [address] [phone] Main / Paging

## 2019-06-13 NOTE — ED Notes (Signed)
Pt to CT

## 2019-06-13 NOTE — Anesthesia Postprocedure Evaluation (Signed)
Anesthesia Post Note  Patient: [name] [name]  Procedure(s) Performed: exam under anesthesia incision and drainage peri rectal abcess (N/A Rectum)     Patient location during evaluation: PACU Anesthesia Type: General Level of consciousness: awake and alert, oriented and patient cooperative Pain management: pain level controlled Vital Signs Assessment: post-procedure vital signs reviewed and stable Respiratory status: spontaneous breathing, nonlabored ventilation and respiratory function stable Cardiovascular status: blood pressure returned to baseline and stable Postop Assessment: no apparent nausea or vomiting Anesthetic complications: no    Last Vitals:  Vitals:   06/13/19 1533 06/13/19 1545  BP: (!) 134/92 130/82  Pulse: (!) 136 (!) 114  Resp: (!) 22 14  Temp: 36.7 C   SpO2: 100% 97%    Last Pain:  Vitals:   06/13/19 1533  TempSrc:   PainSc: 0-No pain                 Davis Ambrosini,E. Lekeya Rollings

## 2019-06-14 LAB — HIV ANTIBODY (ROUTINE TESTING W REFLEX): HIV Screen 4th Generation wRfx: NONREACTIVE

## 2019-06-14 NOTE — Progress Notes (Signed)
1 Day Post-Op   Subjective/Chief Complaint: Feels better  Eating breakfast    Objective: Vital signs in last 24 hours: Temp:  [98 F (36.7 C)-99.1 F (37.3 C)] 98 F (36.7 C) (05/03 0529) Pulse Rate:  [85-136] 85 (05/03 0529) Resp:  [12-22] 20 (05/03 0529) BP: (103-134)/(68-92) 116/80 (05/03 0529) SpO2:  [97 %-100 %] 98 % (05/03 0529) Weight:  [84 kg] 84 kg (05/02 1513) Last BM Date: 06/06/19  Intake/Output from previous day: 05/02 0701 - 05/03 0700 In: 2485.7 [I.V.:2230; IV Piggyback:255.7] Out: 2750 [Urine:2700; Blood:50] Intake/Output this shift: No intake/output data recorded.  Incision/Wound:wanted packing out after breakfast   Lab Results:  Recent Labs    06/13/19 0918  WBC 27.5*  HGB 11.4*  HCT 34.9*  PLT 309   BMET Recent Labs    06/13/19 0918  NA 138  K 3.2*  CL 104  CO2 22  GLUCOSE 120*  BUN 8  CREATININE 0.67  CALCIUM 9.3   PT/INR No results for input(s): LABPROT, INR in the last 72 hours. ABG No results for input(s): PHART, HCO3 in the last 72 hours.  Invalid input(s): PCO2, PO2  Studies/Results: CT PELVIS W CONTRAST  Result Date: 06/13/2019 CLINICAL DATA:  Complex right buttock abscess for 1 week. EXAM: CT PELVIS WITH CONTRAST TECHNIQUE: Multidetector CT imaging of the pelvis was performed using the standard protocol following the bolus administration of intravenous contrast. CONTRAST:  142mL OMNIPAQUE IOHEXOL 300 MG/ML  SOLN COMPARISON:  None. FINDINGS: Urinary Tract: Normal urinary bladder. Normal caliber pelvic ureters. Bowel: Circumferential anal wall thickening. No small or large bowel wall thickening. Large right perianal abscess measuring 11.7 x 4.6 x 7.0 cm (series 3/image 94) extending into the ischioanal fat and medial right gluteal cleft, with surrounding fat stranding. 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 Vascular/Lymphatic: No acute vascular abnormality. No pathologically enlarged pelvic lymph nodes. Reproductive:  Status post hysterectomy, with no abnormal findings at the vaginal cuff. No adnexal mass. Other:  No pelvic pneumoperitoneum or ascites. Musculoskeletal: No aggressive appearing focal osseous lesions. IMPRESSION: Large 11.7 x 4.6 x 7.0 cm right perianal abscess extending into the ischioanal fat and medial right gluteal cleft. 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 Electronically Signed   By: Ilona Sorrel M.D.   On: 06/13/2019 11:07    Anti-infectives: Anti-infectives (From admission, onward)   Start     Dose/Rate Route Frequency Ordered Stop   06/13/19 1900  piperacillin-tazobactam (ZOSYN) IVPB 3.375 g     3.375 g 12.5 mL/hr over 240 Minutes Intravenous Every 8 hours 06/13/19 1632     06/13/19 1344  ceFAZolin (ANCEF) 2-4 GM/100ML-% IVPB    Note to Pharmacy: Marquis Buggy   : cabinet override      06/13/19 1344 06/14/19 0159   06/13/19 1200  ceFAZolin (ANCEF) IVPB 2g/100 mL premix     2 g 200 mL/hr over 30 Minutes Intravenous On call to O.R. 06/13/19 1156 06/13/19 1443   06/13/19 1200  metroNIDAZOLE (FLAGYL) IVPB 500 mg  Status:  Discontinued     500 mg 100 mL/hr over 60 Minutes Intravenous On call to O.R. 06/13/19 1156 06/13/19 1655   06/13/19 1100  vancomycin (VANCOCIN) IVPB 1000 mg/200 mL premix  Status:  Discontinued     1,000 mg 200 mL/hr over 60 Minutes Intravenous  Once 06/13/19 1048 06/13/19 1157   06/13/19 1030  piperacillin-tazobactam (ZOSYN) IVPB 3.375 g     3.375 g 100 mL/hr over 30 Minutes Intravenous  Once 06/13/19 1024 06/13/19 1130  Assessment/Plan: s/p Procedure(s): exam under anesthesia incision and drainage peri rectal abcess (N/A) Remove packing later today Continue IV ABX Home tomorrow   LOS: 1 day    Marcello Moores A Undrea Shipes 06/14/2019

## 2019-06-15 MED ORDER — SACCHAROMYCES BOULARDII 250 MG PO CAPS
250.0000 mg | ORAL_CAPSULE | Freq: Two times a day (BID) | ORAL | Status: DC
  Administered 2019-06-15: 250 mg via ORAL
  Filled 2019-06-15: qty 1

## 2019-06-15 MED ORDER — AMOXICILLIN-POT CLAVULANATE 875-125 MG PO TABS
1.0000 | ORAL_TABLET | Freq: Two times a day (BID) | ORAL | Status: DC
  Administered 2019-06-15: 1 via ORAL
  Filled 2019-06-15: qty 1

## 2019-06-15 MED ORDER — IBUPROFEN 200 MG PO TABS: ORAL_TABLET | ORAL | 0 refills | Status: DC

## 2019-06-15 MED ORDER — SACCHAROMYCES BOULARDII 250 MG PO CAPS: ORAL_CAPSULE | ORAL | Status: DC

## 2019-06-15 MED ORDER — AMOXICILLIN-POT CLAVULANATE 875-125 MG PO TABS: 1.0000 | ORAL_TABLET | Freq: Two times a day (BID) | ORAL | 0 refills | Status: DC

## 2019-06-15 MED ORDER — ACETAMINOPHEN 325 MG PO TABS: ORAL_TABLET | ORAL | Status: DC

## 2019-06-15 MED ORDER — OXYCODONE HCL 5 MG PO TABS: ORAL_TABLET | ORAL | 0 refills | Status: DC

## 2019-06-15 NOTE — Discharge Summary (Signed)
Patient received discharge packet and education.  Patient acknowledged information and verified understanding.

## 2019-06-15 NOTE — Plan of Care (Signed)
  Problem: Health Behavior/Discharge Planning: Goal: Ability to manage health-related needs will improve Outcome: Progressing   Problem: Education: Goal: Knowledge of General Education information will improve Description: Including pain rating scale, medication(s)/side effects and non-pharmacologic comfort measures Outcome: Progressing   Problem: Clinical Measurements: Goal: Ability to maintain clinical measurements within normal limits will improve Outcome: Progressing   

## 2019-06-15 NOTE — Progress Notes (Signed)
2 Days Post-Op    CC: Right buttocks pain  Subjective: She feels much better this morning she is sitting up eating breakfast and ready to go home.  Objective: Vital signs in last 24 hours: Temp:  [98.3 F (36.8 C)-98.5 F (36.9 C)] 98.3 F (36.8 C) (05/04 0557) Pulse Rate:  [79-93] 93 (05/04 0557) Resp:  [16-18] 16 (05/04 0557) BP: (117-141)/(74-100) 141/100 (05/04 0557) SpO2:  [100 %] 100 % (05/04 0557) Last BM Date: 06/14/19  Intake/Output from previous day: 05/03 0701 - 05/04 0700 In: 649 [P.O.:120; I.V.:473.8; IV Piggyback:55.2] Out: -  Intake/Output this shift: No intake/output data recorded.  General appearance: alert, cooperative and no distress Resp: clear to auscultation bilaterally GI: soft, non-tender; bowel sounds normal; no masses,  no organomegaly Skin: Open area has little bit of fecal staining.  Site is 5 cm x 4 cm deep.  No drainage or erythema around the open area.  Lab Results:  Recent Labs    06/13/19 0918  WBC 27.5*  HGB 11.4*  HCT 34.9*  PLT 309    BMET Recent Labs    06/13/19 0918  NA 138  K 3.2*  CL 104  CO2 22  GLUCOSE 120*  BUN 8  CREATININE 0.67  CALCIUM 9.3   PT/INR No results for input(s): LABPROT, INR in the last 72 hours.  No results for input(s): AST, ALT, ALKPHOS, BILITOT, PROT, ALBUMIN in the last 168 hours.   Lipase  No results found for: LIPASE   Medications: . Chlorhexidine Gluconate Cloth  6 each Topical Once  . enoxaparin (LOVENOX) injection  40 mg Subcutaneous Q24H  . lip balm  1 application Topical BID  .  morphine injection  4 mg Intravenous Once  . nicotine  14 mg Transdermal Daily  . pantoprazole  20 mg Oral Daily  . sodium chloride flush  3 mL Intravenous Q12H   . sodium chloride    . sodium chloride    . lactated ringers    . lactated ringers 100 mL/hr at 06/14/19 1040  . piperacillin-tazobactam (ZOSYN)  IV 3.375 g (06/15/19 0350)   Assessment/Plan Hx tobacco use  Ischiorectal perirectal  abscess/external hemorrhoids Anorectal exam under anesthesia incision and drainage of ischio rectal abscess 06/13/2019 Dr. Michael Boston  FEN: IV fluids/regular diet ID: Ancef x1 5/2; Zosyn 5/2 >> day 3 DVT: Lovenox Follow-up: [facility]   Plan: Discharge home on total of 10 days of antibiotics.  She has been instructed to use Tylenol and alternate with ibuprofen for pain relief.  She also be given some oxycodone for pain not relieved by Tylenol/ibuprofen.  She will follow-up in the [facility] clinic.     LOS: 2 days    Lynee Rosenbach 06/15/2019 Please see Amion

## 2019-06-16 NOTE — Discharge Summary (Signed)
Physician Discharge Summary  Patient ID: [name] [name] MRN: [id number] DOB/AGE: Jun 24, 1980 39 y.o.  Admit date: 06/13/2019 Discharge date: 06/16/2019  Admission Diagnoses:   Ischiorectal perirectal abscess, possible anal fistula Tobacco use  Discharge Diagnoses:  Ischiorectal perirectal abscess External hemorrhoids Tobacco use  Principal Problem:   Ischiorectal abscess and fistula Active Problems:   Tobacco abuse   PROCEDURES: Anorectal exam under anesthesia incision and drainage of ischio rectal abscess 06/13/2019 Dr. Cyndia Bent Course: The patient first noticed a painful bump on her right glute last Saturday, which over the course of the past week became increasingly painful and large. She has not taken antibiotics for the current episode.  She states she has had "boils" in the same area in the past, which have always resolved on their own.  This time the area got much larger and more painful.  She has not moved her bowels since last Saturday due to the pressure from her abscess.  CT scan confirmed 11.7x4.6x7.0cm abscess extending into the ischioanal fat and medial right gluteal cleft, with possibility of right posterior perianal fistula.  She is a current smoker. Non-diabetic. Immunocompetent.  Admits to having hemorrhoids. She has not eaten since yesterday evening.  She was seen in the ED and admitted.  She was taken to the OR by Dr. Johney Maine with the above noted procedure.  She tolerated the procedure well and returned to the floor.  She was kept on IV antibiotics and then sent home on a total of 10 days of oral antibiotics.  She was doing well and will follow up in the office in 2 weeks.    CBC Latest Ref Rng & Units 06/13/2019 03/10/2018 06/06/2014  WBC 4.0 - 10.5 K/uL 27.5(H) 15.2(H) 6.1  Hemoglobin 12.0 - 15.0 g/dL 11.4(L) 12.1 9.3(L)  Hematocrit 36.0 - 46.0 % 34.9(L) 38.4 32.3(L)  Platelets 150 - 400 K/uL 309 224 232    CMP Latest Ref Rng & Units 06/13/2019 03/10/2018  06/06/2014  Glucose 70 - 99 mg/dL 120(H) 94 97  BUN 6 - 20 mg/dL 8 <5(L) 7  Creatinine 0.44 - 1.00 mg/dL 0.67 0.68 0.68  Sodium 135 - 145 mmol/L 138 136 136  Potassium 3.5 - 5.1 mmol/L 3.2(L) 3.1(L) 3.9  Chloride 98 - 111 mmol/L 104 102 109  CO2 22 - 32 mmol/L 22 24 22   Calcium 8.9 - 10.3 mg/dL 9.3 9.6 8.9  Total Protein 6.0 - 8.3 g/dL - - 7.5  Total Bilirubin 0.3 - 1.2 mg/dL - - 0.2(L)  Alkaline Phos 39 - 117 U/L - - 111  AST 0 - 37 U/L - - 21  ALT 0 - 35 U/L - - 15    Condition on Discharge:  Improved    Disposition: Discharge disposition: 01-Home or Self Care        Allergies as of 06/15/2019   No Known Allergies     Medication List    STOP taking these medications   lidocaine 2 % solution Commonly known as: XYLOCAINE   OVER THE COUNTER MEDICATION   pantoprazole 20 MG tablet Commonly known as: Protonix     TAKE these medications   acetaminophen 325 MG tablet Commonly known as: TYLENOL You can take 1000 mg of Tylenol/acetaminophen every 8 hours as needed for pain.  You can alternate with plain ibuprofen.  Do not take more than 4000 mg of Tylenol per day can harm your liver.  You can buy this over-the-counter at any drugstore. What changed:  how much to take  how to take this  when to take this  reasons to take this  additional instructions   amoxicillin-clavulanate 875-125 MG tablet Commonly known as: AUGMENTIN Take 1 tablet by mouth every 12 (twelve) hours.   ibuprofen 200 MG tablet Commonly known as: ADVIL You can take 2 to 3 tablets every 6 hours as needed for pain.  You can alternate with plain Tylenol/ibuprofen.  You can buy this over-the-counter at any drugstore. What changed:   how much to take  how to take this  when to take this  reasons to take this  additional instructions  Another medication with the same name was removed. Continue taking this medication, and follow the directions you see here.   MELATONIN PO Take 1 tablet  by mouth at bedtime as needed (sleep).   oxyCODONE 5 MG immediate release tablet Commonly known as: Oxy IR/ROXICODONE You can take 1 tablet every 4 hours as needed for pain not relieved by Tylenol/acetaminophen, and ibuprofen.   saccharomyces boulardii 250 MG capsule Commonly known as: FLORASTOR Is your probiotic.  You can buy this over-the-counter at any drugstore.  It does not have to be at this specific probiotic.  Follow package directions.      Follow-up [facility], [place]. Go on 07/06/2019.   Specialty: General Surgery Why: Your appointment is 05/25 8:45 am Please arrive 30 minutes prior to your appointment to check in and fill out paperwork. Bring photo ID and insurance information. Contact information: [place] [phone]          Signed: Earnstine Regal 06/16/2019, 1:45 PM

## 2019-06-18 LAB — CULTURE, BLOOD (ROUTINE X 2)
Culture: NO GROWTH
Special Requests: ADEQUATE

## 2019-06-19 LAB — CULTURE, BLOOD (ROUTINE X 2): Culture: NO GROWTH

## 2019-10-14 DIAGNOSIS — Z5181 Encounter for therapeutic drug level monitoring: Secondary | ICD-10-CM | POA: Diagnosis not present

## 2019-10-16 DIAGNOSIS — Z5181 Encounter for therapeutic drug level monitoring: Secondary | ICD-10-CM | POA: Diagnosis not present

## 2019-10-18 DIAGNOSIS — Z5181 Encounter for therapeutic drug level monitoring: Secondary | ICD-10-CM | POA: Diagnosis not present

## 2019-10-20 DIAGNOSIS — Z5181 Encounter for therapeutic drug level monitoring: Secondary | ICD-10-CM | POA: Diagnosis not present

## 2019-10-22 DIAGNOSIS — Z5181 Encounter for therapeutic drug level monitoring: Secondary | ICD-10-CM | POA: Diagnosis not present

## 2019-10-24 DIAGNOSIS — Z5181 Encounter for therapeutic drug level monitoring: Secondary | ICD-10-CM | POA: Diagnosis not present

## 2019-10-26 DIAGNOSIS — Z5181 Encounter for therapeutic drug level monitoring: Secondary | ICD-10-CM | POA: Diagnosis not present

## 2019-10-28 DIAGNOSIS — Z5181 Encounter for therapeutic drug level monitoring: Secondary | ICD-10-CM | POA: Diagnosis not present

## 2019-10-30 DIAGNOSIS — Z5181 Encounter for therapeutic drug level monitoring: Secondary | ICD-10-CM | POA: Diagnosis not present

## 2020-08-11 DIAGNOSIS — Z5181 Encounter for therapeutic drug level monitoring: Secondary | ICD-10-CM | POA: Diagnosis not present

## 2020-08-15 DIAGNOSIS — Z5181 Encounter for therapeutic drug level monitoring: Secondary | ICD-10-CM | POA: Diagnosis not present

## 2020-08-17 DIAGNOSIS — Z5181 Encounter for therapeutic drug level monitoring: Secondary | ICD-10-CM | POA: Diagnosis not present

## 2020-08-21 DIAGNOSIS — Z5181 Encounter for therapeutic drug level monitoring: Secondary | ICD-10-CM | POA: Diagnosis not present

## 2020-08-23 DIAGNOSIS — Z5181 Encounter for therapeutic drug level monitoring: Secondary | ICD-10-CM | POA: Diagnosis not present

## 2020-09-04 DIAGNOSIS — Z5181 Encounter for therapeutic drug level monitoring: Secondary | ICD-10-CM | POA: Diagnosis not present

## 2020-09-06 DIAGNOSIS — Z5181 Encounter for therapeutic drug level monitoring: Secondary | ICD-10-CM | POA: Diagnosis not present

## 2020-09-11 DIAGNOSIS — Z5181 Encounter for therapeutic drug level monitoring: Secondary | ICD-10-CM | POA: Diagnosis not present

## 2020-09-13 DIAGNOSIS — Z5181 Encounter for therapeutic drug level monitoring: Secondary | ICD-10-CM | POA: Diagnosis not present

## 2020-09-18 DIAGNOSIS — Z5181 Encounter for therapeutic drug level monitoring: Secondary | ICD-10-CM | POA: Diagnosis not present

## 2020-09-20 DIAGNOSIS — Z5181 Encounter for therapeutic drug level monitoring: Secondary | ICD-10-CM | POA: Diagnosis not present

## 2020-09-25 DIAGNOSIS — Z5181 Encounter for therapeutic drug level monitoring: Secondary | ICD-10-CM | POA: Diagnosis not present

## 2020-09-27 DIAGNOSIS — Z5181 Encounter for therapeutic drug level monitoring: Secondary | ICD-10-CM | POA: Diagnosis not present

## 2020-10-02 DIAGNOSIS — Z5181 Encounter for therapeutic drug level monitoring: Secondary | ICD-10-CM | POA: Diagnosis not present

## 2020-10-04 DIAGNOSIS — Z5181 Encounter for therapeutic drug level monitoring: Secondary | ICD-10-CM | POA: Diagnosis not present

## 2020-10-09 DIAGNOSIS — Z5181 Encounter for therapeutic drug level monitoring: Secondary | ICD-10-CM | POA: Diagnosis not present

## 2020-10-11 DIAGNOSIS — Z5181 Encounter for therapeutic drug level monitoring: Secondary | ICD-10-CM | POA: Diagnosis not present

## 2020-10-17 DIAGNOSIS — Z5181 Encounter for therapeutic drug level monitoring: Secondary | ICD-10-CM | POA: Diagnosis not present

## 2020-10-19 DIAGNOSIS — Z5181 Encounter for therapeutic drug level monitoring: Secondary | ICD-10-CM | POA: Diagnosis not present

## 2020-10-23 DIAGNOSIS — Z5181 Encounter for therapeutic drug level monitoring: Secondary | ICD-10-CM | POA: Diagnosis not present

## 2020-10-25 DIAGNOSIS — Z5181 Encounter for therapeutic drug level monitoring: Secondary | ICD-10-CM | POA: Diagnosis not present

## 2020-10-30 DIAGNOSIS — Z5181 Encounter for therapeutic drug level monitoring: Secondary | ICD-10-CM | POA: Diagnosis not present

## 2020-11-01 DIAGNOSIS — Z5181 Encounter for therapeutic drug level monitoring: Secondary | ICD-10-CM | POA: Diagnosis not present

## 2020-11-06 DIAGNOSIS — Z5181 Encounter for therapeutic drug level monitoring: Secondary | ICD-10-CM | POA: Diagnosis not present

## 2020-11-08 DIAGNOSIS — Z5181 Encounter for therapeutic drug level monitoring: Secondary | ICD-10-CM | POA: Diagnosis not present

## 2021-01-11 DIAGNOSIS — Z419 Encounter for procedure for purposes other than remedying health state, unspecified: Secondary | ICD-10-CM | POA: Diagnosis not present

## 2021-02-11 DIAGNOSIS — Z419 Encounter for procedure for purposes other than remedying health state, unspecified: Secondary | ICD-10-CM | POA: Diagnosis not present

## 2021-03-14 DIAGNOSIS — Z419 Encounter for procedure for purposes other than remedying health state, unspecified: Secondary | ICD-10-CM | POA: Diagnosis not present

## 2021-04-11 DIAGNOSIS — Z419 Encounter for procedure for purposes other than remedying health state, unspecified: Secondary | ICD-10-CM | POA: Diagnosis not present

## 2021-05-12 DIAGNOSIS — Z419 Encounter for procedure for purposes other than remedying health state, unspecified: Secondary | ICD-10-CM | POA: Diagnosis not present

## 2021-06-11 DIAGNOSIS — Z419 Encounter for procedure for purposes other than remedying health state, unspecified: Secondary | ICD-10-CM | POA: Diagnosis not present

## 2021-07-12 DIAGNOSIS — Z419 Encounter for procedure for purposes other than remedying health state, unspecified: Secondary | ICD-10-CM | POA: Diagnosis not present

## 2021-08-11 DIAGNOSIS — Z419 Encounter for procedure for purposes other than remedying health state, unspecified: Secondary | ICD-10-CM | POA: Diagnosis not present

## 2021-09-11 DIAGNOSIS — Z419 Encounter for procedure for purposes other than remedying health state, unspecified: Secondary | ICD-10-CM | POA: Diagnosis not present

## 2021-10-12 DIAGNOSIS — Z419 Encounter for procedure for purposes other than remedying health state, unspecified: Secondary | ICD-10-CM | POA: Diagnosis not present

## 2021-11-11 DIAGNOSIS — Z419 Encounter for procedure for purposes other than remedying health state, unspecified: Secondary | ICD-10-CM | POA: Diagnosis not present

## 2021-12-12 DIAGNOSIS — Z419 Encounter for procedure for purposes other than remedying health state, unspecified: Secondary | ICD-10-CM | POA: Diagnosis not present

## 2021-12-31 IMAGING — CT CT PELVIS W/ CM
2 of 3 series · 16 of 46 positions shown, 18 images · IV contrast (omnipaque)
Comparison: None.

CLINICAL DATA: Complex right buttock abscess for 1 week.

EXAM:
CT PELVIS WITH CONTRAST
TECHNIQUE: Multidetector CT imaging of the pelvis was performed using the
standard protocol following the bolus administration of intravenous
contrast.
CONTRAST:  100mL OMNIPAQUE IOHEXOL 300 MG/ML  SOLN

[Series 3: axial st · axial · 0.75mm/px · z∈[-357,-129]mm · 13 of 132 slices shown, 15 images]
[im 9/132  soft-tissue]
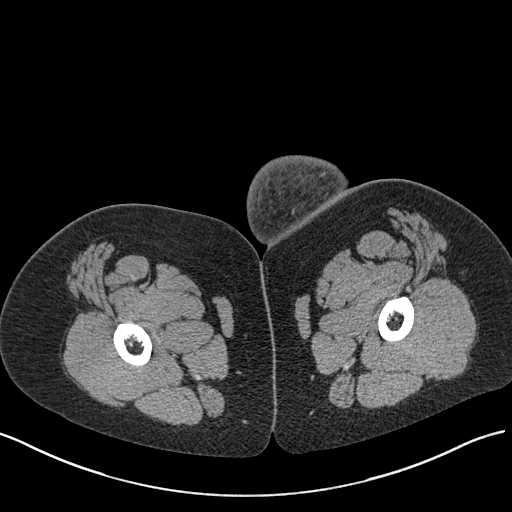
[im 9/132  bone]
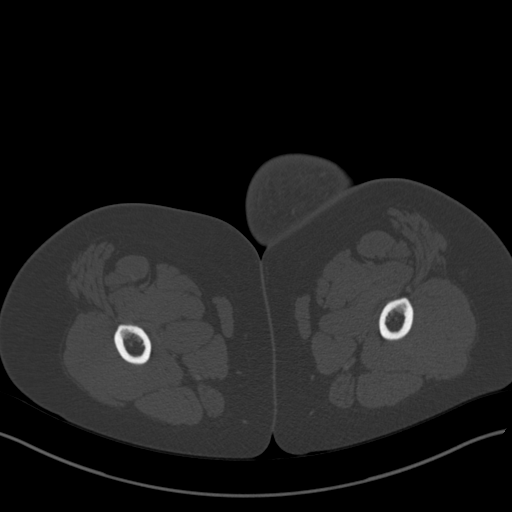
[im 17/132  soft-tissue]
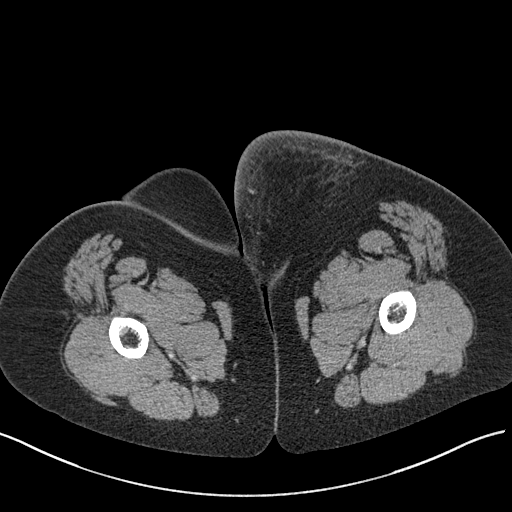
[im 26/132  soft-tissue]
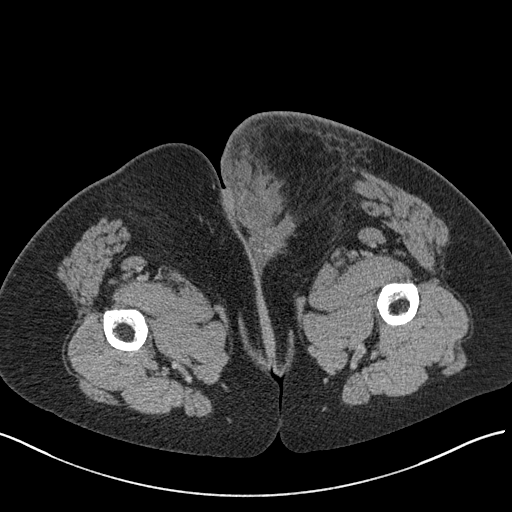
[im 39/132  soft-tissue]
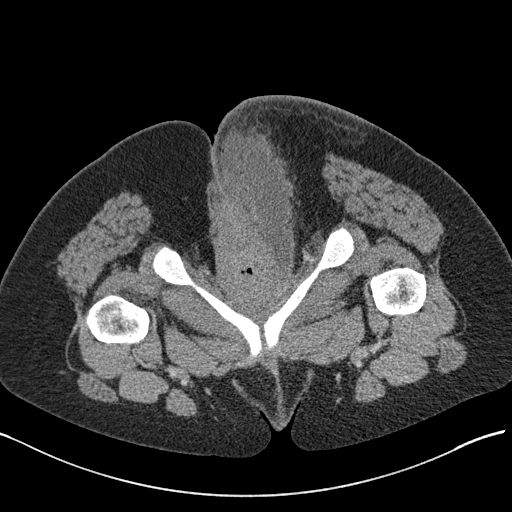
[im 47/132  soft-tissue]
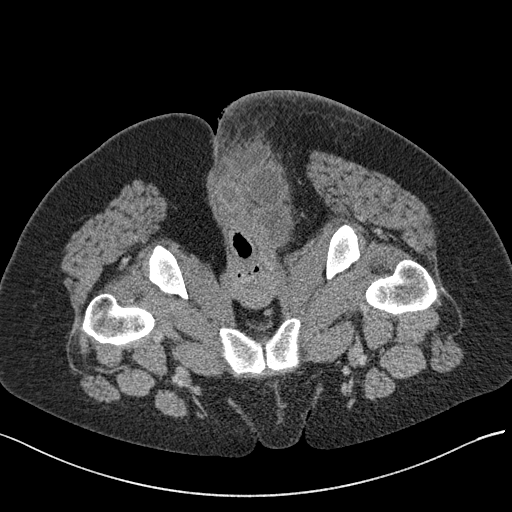
[im 55/132  soft-tissue]
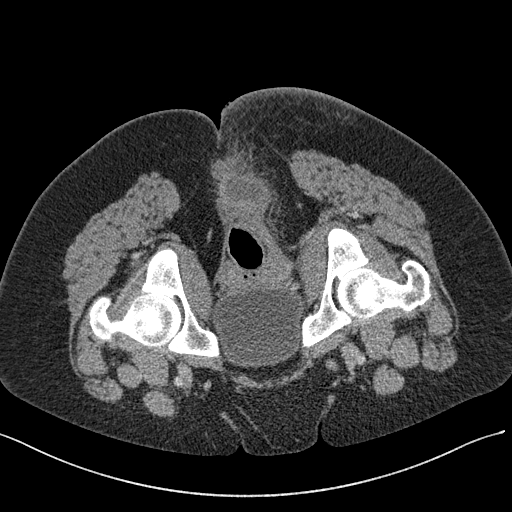
[im 68/132  soft-tissue]
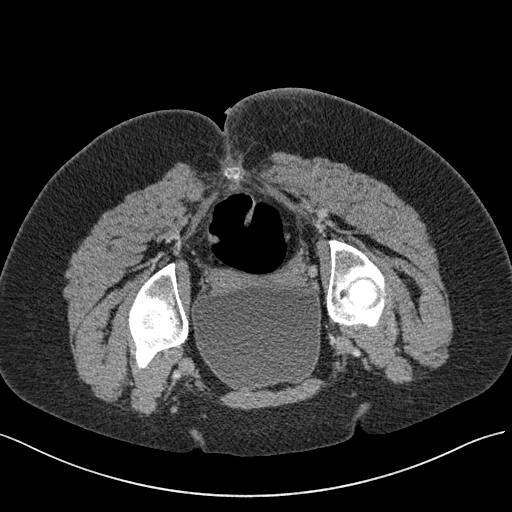
[im 77/132  soft-tissue]
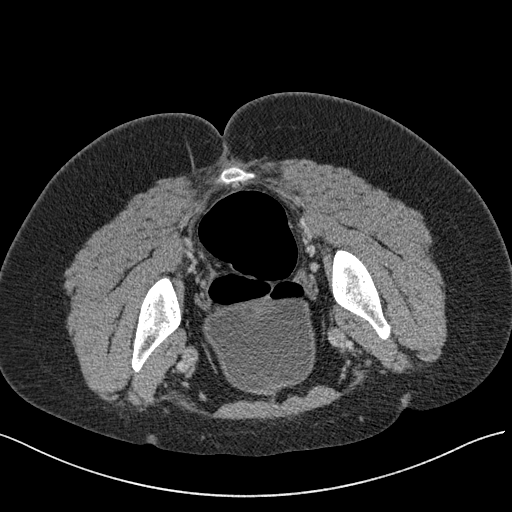
[im 85/132  soft-tissue]
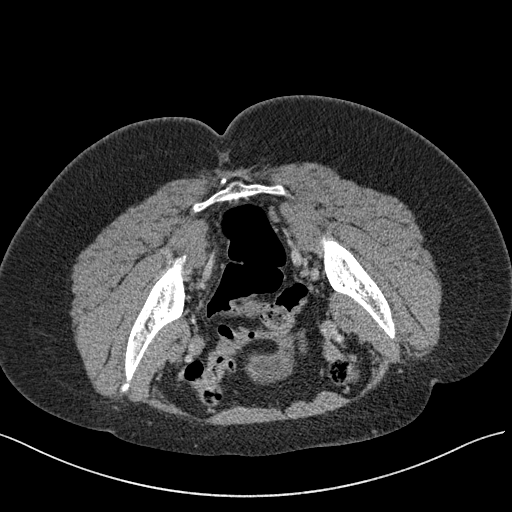
[im 85/132  bone]
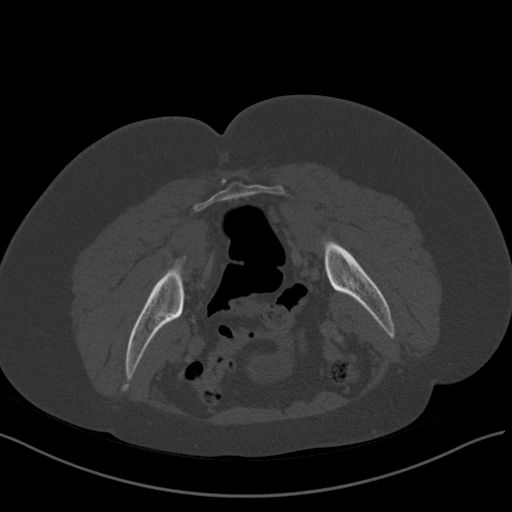
[im 93/132  soft-tissue]
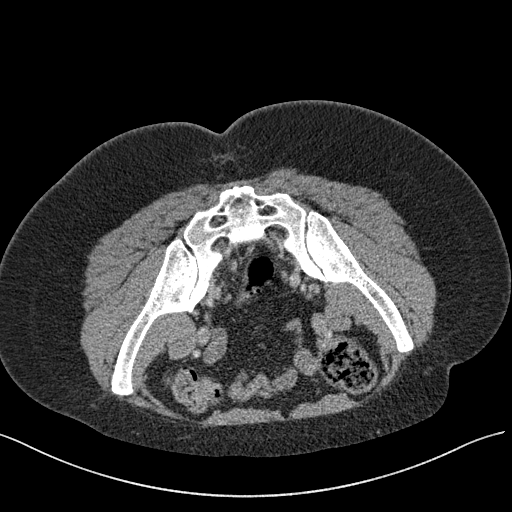
[im 106/132  soft-tissue]
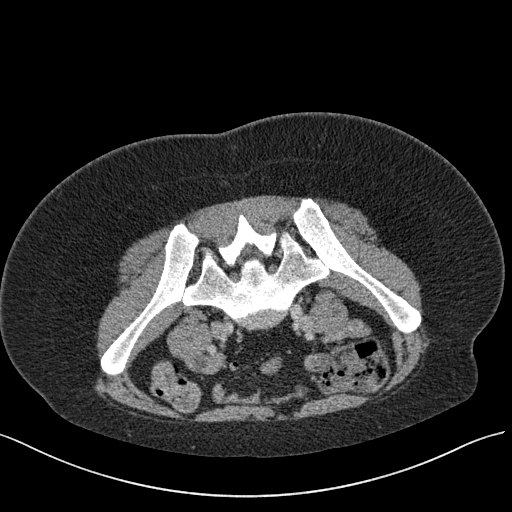
[im 115/132  soft-tissue]
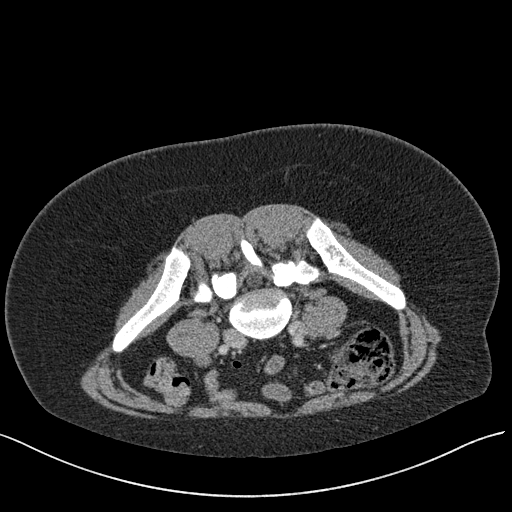
[im 123/132  soft-tissue]
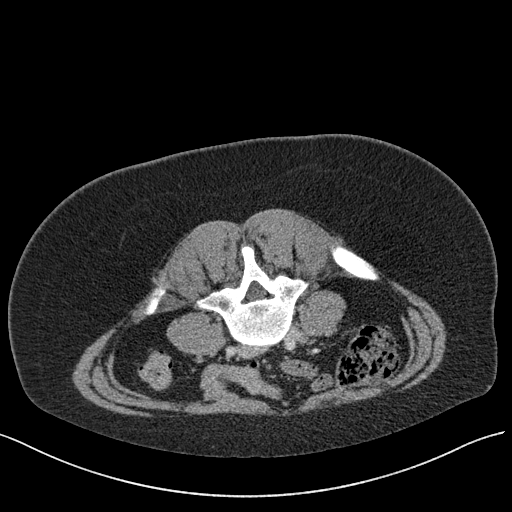

[Series 8: coronal st · coronal · 0.55mm/px · 3 of 147 slices shown]
[im 49/147  soft-tissue]
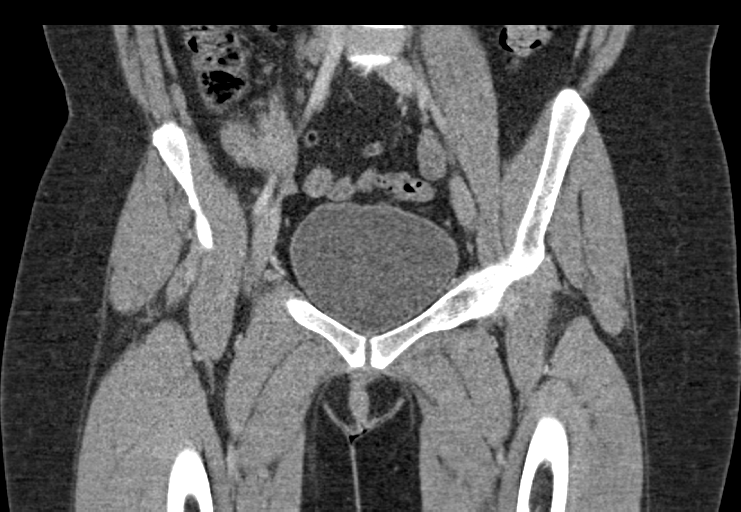
[im 65/147  soft-tissue]
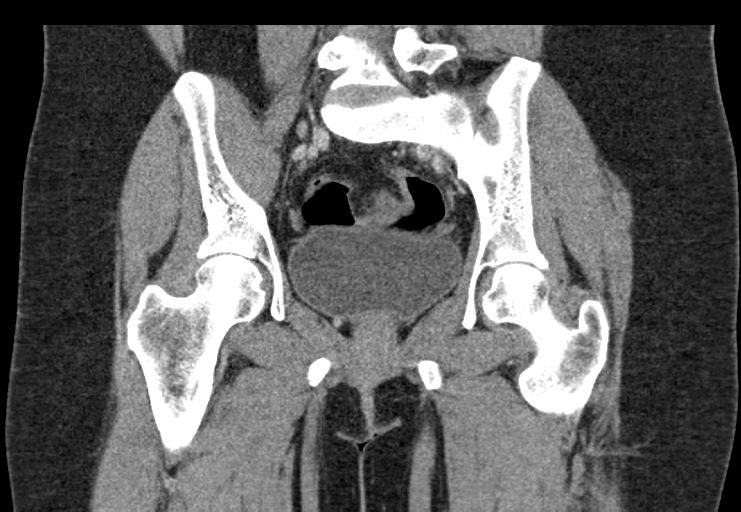
[im 82/147  soft-tissue]
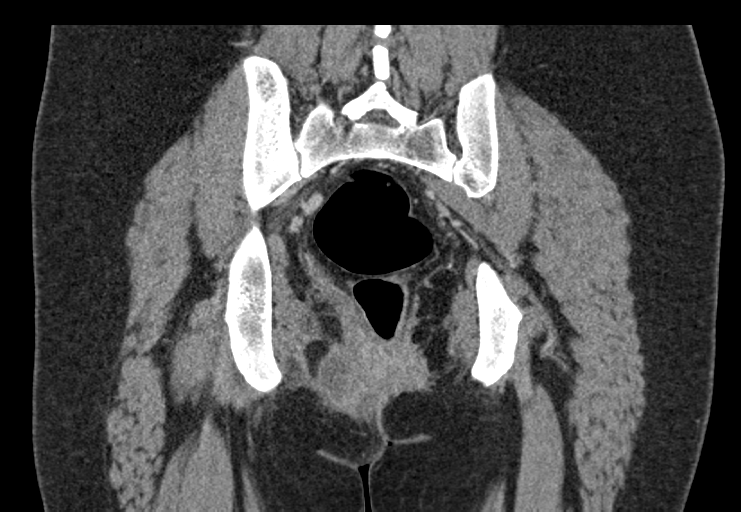

[16 of 46 positions shown; findings below may reference images not displayed]

FINDINGS: Urinary Tract: Normal urinary bladder. Normal caliber pelvic
ureters.

Bowel: Circumferential anal wall thickening. No small or large bowel
wall thickening. Large right perianal abscess measuring 11.7 x 4.6 x
7.0 cm (series 3/image 94) extending into the ischioanal fat and
medial right gluteal cleft, with surrounding fat stranding. Probable
underlying right posterior perianal fistula.

Vascular/Lymphatic: No acute vascular abnormality. No pathologically
enlarged pelvic lymph nodes.

Reproductive: Status post hysterectomy, with no abnormal findings at
the vaginal cuff. No adnexal mass.

Other:  No pelvic pneumoperitoneum or ascites.

Musculoskeletal: No aggressive appearing focal osseous lesions.
IMPRESSION: Large 11.7 x 4.6 x 7.0 cm right perianal abscess extending into the
ischioanal fat and medial right gluteal cleft. Probable underlying
right posterior perianal fistula.

## 2022-01-11 DIAGNOSIS — Z419 Encounter for procedure for purposes other than remedying health state, unspecified: Secondary | ICD-10-CM | POA: Diagnosis not present

## 2022-02-11 DIAGNOSIS — Z419 Encounter for procedure for purposes other than remedying health state, unspecified: Secondary | ICD-10-CM | POA: Diagnosis not present

## 2022-03-14 DIAGNOSIS — Z419 Encounter for procedure for purposes other than remedying health state, unspecified: Secondary | ICD-10-CM | POA: Diagnosis not present

## 2022-04-12 DIAGNOSIS — Z419 Encounter for procedure for purposes other than remedying health state, unspecified: Secondary | ICD-10-CM | POA: Diagnosis not present

## 2022-05-13 DIAGNOSIS — Z419 Encounter for procedure for purposes other than remedying health state, unspecified: Secondary | ICD-10-CM | POA: Diagnosis not present

## 2022-06-12 DIAGNOSIS — Z419 Encounter for procedure for purposes other than remedying health state, unspecified: Secondary | ICD-10-CM | POA: Diagnosis not present

## 2022-07-13 DIAGNOSIS — Z419 Encounter for procedure for purposes other than remedying health state, unspecified: Secondary | ICD-10-CM | POA: Diagnosis not present

## 2022-08-12 DIAGNOSIS — Z419 Encounter for procedure for purposes other than remedying health state, unspecified: Secondary | ICD-10-CM | POA: Diagnosis not present

## 2022-09-12 DIAGNOSIS — Z419 Encounter for procedure for purposes other than remedying health state, unspecified: Secondary | ICD-10-CM | POA: Diagnosis not present

## 2022-10-13 DIAGNOSIS — Z419 Encounter for procedure for purposes other than remedying health state, unspecified: Secondary | ICD-10-CM | POA: Diagnosis not present

## 2022-11-12 ENCOUNTER — Encounter (HOSPITAL_COMMUNITY): Payer: Self-pay

## 2022-11-12 ENCOUNTER — Emergency Department (HOSPITAL_COMMUNITY)
Admission: EM | Admit: 2022-11-12 | Discharge: 2022-11-12 | Disposition: A | Discharge status: Home / Self Care | Payer: No Typology Code available for payment source

## 2022-11-12 ENCOUNTER — Other Ambulatory Visit: Payer: Self-pay

## 2022-11-12 DIAGNOSIS — M542 Cervicalgia: Secondary | ICD-10-CM | POA: Insufficient documentation

## 2022-11-12 DIAGNOSIS — Y92410 Unspecified street and highway as the place of occurrence of the external cause: Secondary | ICD-10-CM | POA: Diagnosis not present

## 2022-11-12 DIAGNOSIS — R1032 Left lower quadrant pain: Secondary | ICD-10-CM | POA: Insufficient documentation

## 2022-11-12 DIAGNOSIS — Z419 Encounter for procedure for purposes other than remedying health state, unspecified: Secondary | ICD-10-CM | POA: Diagnosis not present

## 2022-11-12 DIAGNOSIS — M7918 Myalgia, other site: Secondary | ICD-10-CM

## 2022-11-12 DIAGNOSIS — R109 Unspecified abdominal pain: Secondary | ICD-10-CM | POA: Diagnosis present

## 2022-11-12 DIAGNOSIS — M791 Myalgia, unspecified site: Secondary | ICD-10-CM | POA: Diagnosis not present

## 2022-11-12 MED ORDER — HYDROCODONE-ACETAMINOPHEN 5-325 MG PO TABS: 1.0000 | ORAL_TABLET | Freq: Four times a day (QID) | ORAL | 0 refills | Status: DC | PRN

## 2022-11-12 MED ORDER — CYCLOBENZAPRINE HCL 10 MG PO TABS: 10.0000 mg | ORAL_TABLET | Freq: Two times a day (BID) | ORAL | 0 refills | Status: DC | PRN

## 2022-11-12 MED ORDER — HYDROCODONE-ACETAMINOPHEN 5-325 MG PO TABS
1.0000 | ORAL_TABLET | Freq: Once | ORAL | Status: AC
  Administered 2022-11-12: 1 via ORAL
  Filled 2022-11-12: qty 1

## 2022-11-12 MED ORDER — IBUPROFEN 200 MG PO TABS
600.0000 mg | ORAL_TABLET | Freq: Once | ORAL | Status: AC
  Administered 2022-11-12: 600 mg via ORAL
  Filled 2022-11-12: qty 3

## 2022-11-12 MED ORDER — IBUPROFEN 600 MG PO TABS: 600.0000 mg | ORAL_TABLET | Freq: Four times a day (QID) | ORAL | 0 refills | Status: DC | PRN

## 2022-11-12 MED ORDER — CYCLOBENZAPRINE HCL 10 MG PO TABS
10.0000 mg | ORAL_TABLET | Freq: Once | ORAL | Status: AC
  Administered 2022-11-12: 10 mg via ORAL
  Filled 2022-11-12: qty 1

## 2022-11-12 NOTE — Discharge Instructions (Signed)
Take the medications as prescribed. Rest, apply cool compresses to the sore area for the first 48 hours and alternate warm and cool compresses after that.   Follow up with your doctor as needed if pain persists without improvement in 4-5 days.
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
## 2022-11-19 DIAGNOSIS — M545 Low back pain, unspecified: Secondary | ICD-10-CM | POA: Diagnosis not present

## 2022-11-19 DIAGNOSIS — M542 Cervicalgia: Secondary | ICD-10-CM | POA: Diagnosis not present

## 2022-12-13 DIAGNOSIS — Z419 Encounter for procedure for purposes other than remedying health state, unspecified: Secondary | ICD-10-CM | POA: Diagnosis not present

## 2022-12-24 DIAGNOSIS — M545 Low back pain, unspecified: Secondary | ICD-10-CM | POA: Diagnosis not present

## 2022-12-24 DIAGNOSIS — M542 Cervicalgia: Secondary | ICD-10-CM | POA: Diagnosis not present

## 2022-12-27 ENCOUNTER — Ambulatory Visit: Payer: Medicaid Other

## 2023-01-08 DIAGNOSIS — M545 Low back pain, unspecified: Secondary | ICD-10-CM | POA: Diagnosis not present

## 2023-01-08 DIAGNOSIS — M542 Cervicalgia: Secondary | ICD-10-CM | POA: Diagnosis not present

## 2023-01-11 ENCOUNTER — Ambulatory Visit: Payer: Medicaid Other

## 2023-01-12 DIAGNOSIS — Z419 Encounter for procedure for purposes other than remedying health state, unspecified: Secondary | ICD-10-CM | POA: Diagnosis not present

## 2023-02-03 DIAGNOSIS — M542 Cervicalgia: Secondary | ICD-10-CM | POA: Diagnosis not present

## 2023-02-03 DIAGNOSIS — M545 Low back pain, unspecified: Secondary | ICD-10-CM | POA: Diagnosis not present

## 2023-02-12 DIAGNOSIS — Z419 Encounter for procedure for purposes other than remedying health state, unspecified: Secondary | ICD-10-CM | POA: Diagnosis not present

## 2023-03-15 DIAGNOSIS — Z419 Encounter for procedure for purposes other than remedying health state, unspecified: Secondary | ICD-10-CM | POA: Diagnosis not present

## 2023-04-08 NOTE — Therapy (Deleted)
 OUTPATIENT PHYSICAL THERAPY CERVICAL EVALUATION   Patient Name: [name] [name] MRN: [id number] DOB:[birth year], 43 y.o., female Today's Date: 04/08/2023  END OF SESSION:   Past Medical History:  Diagnosis Date   Anemia    Blood transfusion without reported diagnosis    Late prenatal care complicating pregnancy in second trimester 09/30/2012   At 21.[redacted] weeks    Pregnant    Past Surgical History:  Procedure Laterality Date   ABDOMINAL HYSTERECTOMY     INCISION AND DRAINAGE ABSCESS N/A 06/13/2019   Procedure: exam under anesthesia incision and drainage peri rectal abcess;  Surgeon: Karie Soda, MD;  Location: [facility];  Service: General;  Laterality: N/A;   TUBAL LIGATION     Patient Active Problem List   Diagnosis Date Noted   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   Tobacco abuse 06/13/2019   Anemia 10/08/2011    PCP: Patient, No Pcp Per   REFERRING PROVIDER: Margart Sickles, PA-C   REFERRING DIAG: LT Sided CSP Thorciolumbar pain  THERAPY DIAG:  No diagnosis found.  Rationale for Evaluation and Treatment: Rehabilitation  ONSET DATE: 11/2022  SUBJECTIVE:                                                                                                                                                                                                         SUBJECTIVE STATEMENT: *** Hand dominance: {MISC; OT HAND DOMINANCE:[phone]}  PERTINENT HISTORY:  ***  PAIN:  Are you having pain? {OPRCPAIN:27236}  PRECAUTIONS: None  RED FLAGS: None     WEIGHT BEARING RESTRICTIONS: No  FALLS:  Has patient fallen in last 6 months? No  OCCUPATION: home maker  PLOF: Independent  PATIENT GOALS: ***  NEXT MD VISIT: ***  OBJECTIVE:  Note: Objective measures were completed at Evaluation unless otherwise noted.  DIAGNOSTIC FINDINGS:  None recent  PATIENT SURVEYS:  NDI ***  POSTURE: {posture:25561}  PALPATION: ***   CERVICAL ROM:   {AROM/PROM:27142} ROM  A/PROM (deg) eval  Flexion   Extension   Right lateral flexion   Left lateral flexion   Right rotation   Left rotation    (Blank rows = not tested)  UPPER EXTREMITY ROM:  {AROM/PROM:27142} ROM Right eval Left eval  Shoulder flexion    Shoulder extension    Shoulder abduction    Shoulder adduction    Shoulder extension    Shoulder internal rotation    Shoulder external rotation    Elbow flexion    Elbow extension    Wrist flexion    Wrist extension  Wrist ulnar deviation    Wrist radial deviation    Wrist pronation    Wrist supination     (Blank rows = not tested)  UPPER EXTREMITY MMT:  MMT Right eval Left eval  Shoulder flexion    Shoulder extension    Shoulder abduction    Shoulder adduction    Shoulder extension    Shoulder internal rotation    Shoulder external rotation    Middle trapezius    Lower trapezius    Elbow flexion    Elbow extension    Wrist flexion    Wrist extension    Wrist ulnar deviation    Wrist radial deviation    Wrist pronation    Wrist supination    Grip strength     (Blank rows = not tested) LUMBAR ROM:   {AROM/PROM:27142}  A/PROM  eval  Flexion   Extension   Right lateral flexion   Left lateral flexion   Right rotation   Left rotation    (Blank rows = not tested)  LOWER EXTREMITY MMT:    MMT Right eval Left eval  Hip flexion    Hip extension    Hip abduction    Hip adduction    Hip internal rotation    Hip external rotation    Knee flexion    Knee extension    Ankle dorsiflexion    Ankle plantarflexion    Ankle inversion    Ankle eversion     (Blank rows = not tested)   LOWER EXTREMITY ROM:     {AROM/PROM:27142}  Right eval Left eval  Hip flexion    Hip extension    Hip abduction    Hip adduction    Hip internal rotation    Hip external rotation    Knee flexion    Knee extension    Ankle dorsiflexion    Ankle plantarflexion    Ankle inversion    Ankle eversion     (Blank rows = not  tested)   LUMBAR SPECIAL TESTS:  {lumbar special test:25242}   CERVICAL SPECIAL TESTS:  {Cervical special tests:25246}  FUNCTIONAL TESTS:  30 seconds chair stand test  TREATMENT DATE: ***                                                                                                                                 PATIENT EDUCATION:  Education details: Discussed eval findings, rehab rationale and POC and patient is in agreement  Person educated: Patient Education method: Explanation Education comprehension: verbalized understanding and needs further education  HOME EXERCISE PROGRAM: ***  ASSESSMENT:  CLINICAL IMPRESSION: Patient is a *** y.o. *** who was seen today for physical therapy evaluation and treatment for ***.   OBJECTIVE IMPAIRMENTS: {opptimpairments:25111}.   ACTIVITY LIMITATIONS: {activitylimitations:27494}  PERSONAL FACTORS: {Personal factors:25162} are also affecting patient's functional outcome.   REHAB POTENTIAL: Fair based on non compliance with previous PT appointments  CLINICAL  DECISION MAKING: Stable/uncomplicated  EVALUATION COMPLEXITY: Low   GOALS: Goals reviewed with patient? No  SHORT TERM GOALS: Target date: ***  *** Baseline:  Goal status: INITIAL  2.  *** Baseline:  Goal status: INITIAL  3.  *** Baseline:  Goal status: INITIAL  4.  *** Baseline:  Goal status: INITIAL  5.  *** Baseline:  Goal status: INITIAL  6.  *** Baseline:  Goal status: INITIAL  LONG TERM GOALS: Target date: ***  *** Baseline:  Goal status: INITIAL  2.  *** Baseline:  Goal status: INITIAL  3.  *** Baseline:  Goal status: INITIAL  4.  *** Baseline:  Goal status: INITIAL  5.  *** Baseline:  Goal status: INITIAL  6.  *** Baseline:  Goal status: INITIAL   PLAN:  PT FREQUENCY: 1x/week  PT DURATION: 4 weeks  PLANNED INTERVENTIONS: 97164- PT Re-evaluation, 97110-Therapeutic exercises, 97530- Therapeutic activity, 97112-  Neuromuscular re-education, 97535- Self Care, 13086- Manual therapy, Patient/Family education, Dry Needling, Joint mobilization, and Spinal mobilization  PLAN FOR NEXT SESSION: HEP review and update, manual techniques as appropriate, aerobic tasks, ROM and flexibility activities, strengthening and PREs, TPDN, gait and balance training as needed     Hildred Laser, PT 04/08/2023, 11:52 AM

## 2023-04-09 ENCOUNTER — Ambulatory Visit: Payer: Medicaid Other | Attending: Physician Assistant

## 2023-04-12 DIAGNOSIS — Z419 Encounter for procedure for purposes other than remedying health state, unspecified: Secondary | ICD-10-CM | POA: Diagnosis not present

## 2023-04-23 ENCOUNTER — Emergency Department (HOSPITAL_COMMUNITY)

## 2023-04-23 ENCOUNTER — Other Ambulatory Visit: Payer: Self-pay

## 2023-04-23 ENCOUNTER — Encounter (HOSPITAL_COMMUNITY): Payer: Self-pay | Admitting: *Deleted

## 2023-04-23 ENCOUNTER — Emergency Department (HOSPITAL_COMMUNITY)
Admission: EM | Admit: 2023-04-23 | Discharge: 2023-04-23 | Disposition: A | Discharge status: Home / Self Care | Attending: Emergency Medicine | Admitting: Emergency Medicine

## 2023-04-23 DIAGNOSIS — F17210 Nicotine dependence, cigarettes, uncomplicated: Secondary | ICD-10-CM | POA: Insufficient documentation

## 2023-04-23 DIAGNOSIS — D72829 Elevated white blood cell count, unspecified: Secondary | ICD-10-CM | POA: Diagnosis not present

## 2023-04-23 DIAGNOSIS — J36 Peritonsillar abscess: Secondary | ICD-10-CM | POA: Insufficient documentation

## 2023-04-23 DIAGNOSIS — R000 Tachycardia, unspecified: Secondary | ICD-10-CM | POA: Insufficient documentation

## 2023-04-23 DIAGNOSIS — R110 Nausea: Secondary | ICD-10-CM

## 2023-04-23 DIAGNOSIS — J029 Acute pharyngitis, unspecified: Secondary | ICD-10-CM | POA: Diagnosis not present

## 2023-04-23 LAB — I-STAT CHEM 8, ED
BUN: 9 mg/dL (ref 6–20)
Calcium, Ion: 1.19 mmol/L (ref 1.15–1.40)
Chloride: 103 mmol/L (ref 98–111)
Creatinine, Ser: 0.8 mg/dL (ref 0.44–1.00)
Glucose, Bld: 139 mg/dL — ABNORMAL HIGH (ref 70–99)
HCT: 36 % (ref 36.0–46.0)
Hemoglobin: 12.2 g/dL (ref 12.0–15.0)
Potassium: 4 mmol/L (ref 3.5–5.1)
Sodium: 138 mmol/L (ref 135–145)
TCO2: 26 mmol/L (ref 22–32)

## 2023-04-23 LAB — CBC WITH DIFFERENTIAL/PLATELET
Abs Immature Granulocytes: 0.07 10*3/uL (ref 0.00–0.07)
Basophils Absolute: 0 10*3/uL (ref 0.0–0.1)
Basophils Relative: 0 %
Eosinophils Absolute: 0 10*3/uL (ref 0.0–0.5)
Eosinophils Relative: 0 %
HCT: 36.6 % (ref 36.0–46.0)
Hemoglobin: 11.6 g/dL — ABNORMAL LOW (ref 12.0–15.0)
Immature Granulocytes: 1 %
Lymphocytes Relative: 7 %
Lymphs Abs: 0.9 10*3/uL (ref 0.7–4.0)
MCH: 29.3 pg (ref 26.0–34.0)
MCHC: 31.7 g/dL (ref 30.0–36.0)
MCV: 92.4 fL (ref 80.0–100.0)
Monocytes Absolute: 0.2 10*3/uL (ref 0.1–1.0)
Monocytes Relative: 2 %
Neutro Abs: 12.1 10*3/uL — ABNORMAL HIGH (ref 1.7–7.7)
Neutrophils Relative %: 90 %
Platelets: 367 10*3/uL (ref 150–400)
RBC: 3.96 MIL/uL (ref 3.87–5.11)
RDW: 13.3 % (ref 11.5–15.5)
WBC: 13.3 10*3/uL — ABNORMAL HIGH (ref 4.0–10.5)
nRBC: 0 % (ref 0.0–0.2)

## 2023-04-23 LAB — BASIC METABOLIC PANEL
Anion gap: 14 (ref 5–15)
BUN: 9 mg/dL (ref 6–20)
CO2: 25 mmol/L (ref 22–32)
Calcium: 9.6 mg/dL (ref 8.9–10.3)
Chloride: 100 mmol/L (ref 98–111)
Creatinine, Ser: 0.57 mg/dL (ref 0.44–1.00)
GFR, Estimated: >60 mL/min (ref >60)
Glucose, Bld: 136 mg/dL — ABNORMAL HIGH (ref 70–99)
Potassium: 4 mmol/L (ref 3.5–5.1)
Sodium: 139 mmol/L (ref 135–145)

## 2023-04-23 LAB — HCG, QUANTITATIVE, PREGNANCY: hCG, Beta Chain, Quant, S: 1 m[IU]/mL (ref <5)

## 2023-04-23 MED ORDER — IOHEXOL 350 MG/ML SOLN
75.0000 mL | Freq: Once | INTRAVENOUS | Status: AC | PRN
  Administered 2023-04-23: 75 mL via INTRAVENOUS

## 2023-04-23 MED ORDER — AMOXICILLIN-POT CLAVULANATE 875-125 MG PO TABS: 1.0000 | ORAL_TABLET | Freq: Two times a day (BID) | ORAL | 0 refills | Status: AC

## 2023-04-23 MED ORDER — ALUM & MAG HYDROXIDE-SIMETH 200-200-20 MG/5ML PO SUSP
30.0000 mL | Freq: Once | ORAL | Status: AC
  Administered 2023-04-23: 30 mL via ORAL
  Filled 2023-04-23: qty 30

## 2023-04-23 MED ORDER — LIDOCAINE VISCOUS HCL 2 % MT SOLN
15.0000 mL | Freq: Once | OROMUCOSAL | Status: AC
  Administered 2023-04-23: 15 mL via ORAL
  Filled 2023-04-23: qty 15

## 2023-04-23 MED ORDER — KETOROLAC TROMETHAMINE 30 MG/ML IJ SOLN
15.0000 mg | Freq: Once | INTRAMUSCULAR | Status: AC
  Administered 2023-04-23: 15 mg via INTRAVENOUS
  Filled 2023-04-23: qty 1

## 2023-04-23 MED ORDER — ONDANSETRON HCL 4 MG/2ML IJ SOLN
4.0000 mg | Freq: Once | INTRAMUSCULAR | Status: AC
  Administered 2023-04-23: 4 mg via INTRAVENOUS
  Filled 2023-04-23: qty 2

## 2023-04-23 NOTE — ED Notes (Signed)
 Pt requesting pain meds due to pain and wants an update

## 2023-04-23 NOTE — ED Triage Notes (Signed)
 Pt had 3 teeth pulled 04/14/23 and she has been having a sore throat since.  Pt has been on abt since procedure.  Pt was seen at [facility] and was told that she needed to come for further evaluation of abscess. Pt has swelling to right back of her throat, no sob.

## 2023-04-23 NOTE — Discharge Instructions (Addendum)
 Thank you for letting us evaluate you today. Dr. Jearld Fenton expressed your abscess. I have sent augmentin to your pharmacy to take. Please make sure to take all of it even if you feel better. Should feel better in 3-4 days. If not, follow up with Dr. Jearld Fenton who I recommended above  Return to ED if worsening symptoms, hoarse voice, unable to swallow liquids

## 2023-04-23 NOTE — Consult Note (Signed)
 Reason for Consult:PTA Referring Physician: Dr Herschel Senegal is an 43 y.o. female.  HPI: hx of week of sore throat and worsened, she had tooth pulled and had amoxicillin for sinusitis. She haas not had tonsillitis previous. No breathing issues  Past Medical History:  Diagnosis Date   Anemia    Blood transfusion without reported diagnosis    Late prenatal care complicating pregnancy in second trimester 09/30/2012   At 21.[redacted] weeks    Pregnant     Past Surgical History:  Procedure Laterality Date   ABDOMINAL HYSTERECTOMY     INCISION AND DRAINAGE ABSCESS N/A 06/13/2019   Procedure: exam under anesthesia incision and drainage peri rectal abcess;  Surgeon: Karie Soda, MD;  Location: [facility];  Service: General;  Laterality: N/A;   TUBAL LIGATION      Family History  Problem Relation Age of Onset   Healthy Mother    Diabetes Other    Healthy Father     Social History:  reports that she has been smoking cigarettes. She has a 2.5 pack-year smoking history. She has never used smokeless tobacco. She reports current alcohol use. She reports current drug use. Drug: Cocaine.  Allergies: No Known Allergies  Medications: I have reviewed the patient's current medications.  Results for orders placed or performed during the hospital encounter of 04/23/23 (from the past 48 hours)  CBC with Differential     Status: Abnormal   Collection Time: 04/23/23  4:18 PM  Result Value Ref Range   WBC 13.3 (H) 4.0 - 10.5 K/uL   RBC 3.96 3.87 - 5.11 MIL/uL   Hemoglobin 11.6 (L) 12.0 - 15.0 g/dL   HCT 08.6 57.8 - 46.9 %   MCV 92.4 80.0 - 100.0 fL   MCH 29.3 26.0 - 34.0 pg   MCHC 31.7 30.0 - 36.0 g/dL   RDW 62.9 52.8 - 41.3 %   Platelets 367 150 - 400 K/uL   nRBC 0.0 0.0 - 0.2 %   Neutrophils Relative % 90 %   Neutro Abs 12.1 (H) 1.7 - 7.7 K/uL   Lymphocytes Relative 7 %   Lymphs Abs 0.9 0.7 - 4.0 K/uL   Monocytes Relative 2 %   Monocytes Absolute 0.2 0.1 - 1.0 K/uL   Eosinophils Relative 0  %   Eosinophils Absolute 0.0 0.0 - 0.5 K/uL   Basophils Relative 0 %   Basophils Absolute 0.0 0.0 - 0.1 K/uL   Immature Granulocytes 1 %   Abs Immature Granulocytes 0.07 0.00 - 0.07 K/uL    Comment: Performed at [facility], [address], [place], [place] [address]  Basic metabolic panel     Status: Abnormal   Collection Time: 04/23/23  4:18 PM  Result Value Ref Range   Sodium 139 135 - 145 mmol/L   Potassium 4.0 3.5 - 5.1 mmol/L   Chloride 100 98 - 111 mmol/L   CO2 25 22 - 32 mmol/L   Glucose, Bld 136 (H) 70 - 99 mg/dL    Comment: Glucose reference range applies only to samples taken after fasting for at least 8 hours.   BUN 9 6 - 20 mg/dL   Creatinine, Ser 0.27 0.44 - 1.00 mg/dL   Calcium 9.6 8.9 - 25.3 mg/dL   GFR, Estimated >66 >44 mL/min    Comment: (NOTE) Calculated using the CKD-EPI Creatinine Equation (2021)    Anion gap 14 5 - 15    Comment: Performed at [facility],  [address] [place], [place] [address]  hCG, quantitative, pregnancy     Status: None   Collection Time: 04/23/23  4:18 PM  Result Value Ref Range   hCG, Beta Chain, Quant, S 1 <5 mIU/mL    Comment:          GEST. AGE      CONC.  (mIU/mL)   <=1 WEEK        5 - 50     2 WEEKS       50 - 500     3 WEEKS       100 - 10,000     4 WEEKS     1,000 - 30,000     5 WEEKS     3,500 - 115,000   6-8 WEEKS     12,000 - 270,000    12 WEEKS     15,000 - 220,000        FEMALE AND NON-PREGNANT FEMALE:     LESS THAN 5 mIU/mL Performed at [facility], [address], [place], [place] [address]   I-stat chem 8, ED (not at [facility], [facility] or [facility])     Status: Abnormal   Collection Time: 04/23/23  5:07 PM  Result Value Ref Range   Sodium 138 135 - 145 mmol/L   Potassium 4.0 3.5 - 5.1 mmol/L   Chloride 103 98 - 111 mmol/L   BUN 9 6 - 20 mg/dL   Creatinine, Ser 6.57 0.44 - 1.00 mg/dL   Glucose, Bld 846 (H) 70 - 99 mg/dL    Comment: Glucose reference range applies only to samples taken after fasting  for at least 8 hours.   Calcium, Ion 1.19 1.15 - 1.40 mmol/L   TCO2 26 22 - 32 mmol/L   Hemoglobin 12.2 12.0 - 15.0 g/dL   HCT 96.2 95.2 - 84.1 %    CT Soft Tissue Neck W Contrast Result Date: 04/23/2023 CLINICAL DATA:  Soft tissue infection suspected EXAM: CT NECK WITH CONTRAST TECHNIQUE: Multidetector CT imaging of the neck was performed using the standard protocol following the bolus administration of intravenous contrast. RADIATION DOSE REDUCTION: This exam was performed according to the departmental dose-optimization program which includes automated exposure control, adjustment of the mA and/or kV according to patient size and/or use of iterative reconstruction technique. CONTRAST:  75mL OMNIPAQUE IOHEXOL 350 MG/ML SOLN COMPARISON:  03/11/2018 FINDINGS: Pharynx and larynx: Right palatine peritonsillar abscess measuring 2.2 x 1.6 cm. No retropharyngeal abnormality. The epiglottis is normal. Salivary glands: No inflammation, mass, or stone. Thyroid: Normal. Lymph nodes: Right-greater-than-left reactive cervical lymph nodes Vascular: Negative. Limited intracranial: Negative. Visualized orbits: Negative. Mastoids and visualized paranasal sinuses: Clear. Skeleton: No acute or aggressive process. Upper chest: Negative. Other: None. IMPRESSION: Right palatine peritonsillar abscess measuring 2.2 x 1.6 cm. Electronically Signed   By: Deatra Robinson M.D.   On: 04/23/2023 20:06    ROS Blood pressure (!) 125/105, pulse (!) 119, temperature 98.8 F (37.1 C), resp. rate 20, last menstrual period 06/06/2014, SpO2 100%. Physical Exam Constitutional:      Appearance: Normal appearance.  HENT:     Right Ear: External ear normal.     Left Ear: External ear normal.     Nose: Nose normal.     Mouth/Throat:     Comments: Right swelling over soft palate and bulging. Opens mouth well. No stridor.  Eyes:     Extraocular Movements: Extraocular movements intact.     Pupils: Pupils are  equal, round, and reactive to  light.  Musculoskeletal:     Cervical back: Normal range of motion.  Neurological:     Mental Status: She is alert.       Assessment/Plan: right PTA-  Pt now needs I/D as he has failed medical therapy. We discussed I/D and risks, benefits and options,. All questions answered and consent obtained. The OP sprayed with hurricane and injected the peritonsillar area with 1% lido with epi 2 cc. An incision made in the right peritonsillar area and tonsil hemastat opened the space with significant pus expressed. Minimal bleeding and she tolerated it well. She  will get augmentin  and f/u in 2 weeks. She  should be almost completely better in 2-3 days and if not return   [name] [name] 04/23/2023, 9:22 PM

## 2023-04-23 NOTE — ED Provider Notes (Signed)
 [place] EMERGENCY DEPARTMENT AT [facility] Provider Note   CSN: [id number] Arrival date & time: 04/23/23  1555     History  Chief Complaint  Patient presents with   Abscess    tonsillar    [name] [name] is a 43 y.o. female with PMHx of anemia, tobacco abuse presents to ED for evaluation of sore throat, chills, difficulty swallowing, vomiting since 04/15/23. On 04/14/23, patient had three teeth pulled. Dentist found sinusitis on XR and placed on amoxicillin. She completed this yesterday. She has been able to swallow liquids without difficulty.  She also has complaints of acid reflux from increased usage of ibuprofen post teeth extraction  Was seen at [facility] and recopmmended here for CT and ENT f/u. Provded rocephin 1g and decadron IM PTA.   Abscess Associated symptoms: no fatigue, no fever, no headaches, no nausea and no vomiting        Home Medications Prior to Admission medications   Medication Sig Start Date End Date Taking? Authorizing Provider  acetaminophen-codeine (TYLENOL #3) 300-30 MG tablet Take 1 tablet by mouth every 6 (six) hours as needed (dental pain). 04/15/23  Yes [provider]  amoxicillin (AMOXIL) 500 MG tablet Take 500 mg by mouth every 8 (eight) hours. 04/14/23  Yes [provider]  amoxicillin-clavulanate (AUGMENTIN) 875-125 MG tablet Take 1 tablet by mouth every 12 (twelve) hours for 10 days. 04/23/23 05/03/23 Yes Judithann Sheen, PA  clindamycin (CLEOCIN) 300 MG capsule Take 300 mg by mouth 3 (three) times daily. 04/23/23 05/03/23 Yes [provider]  ibuprofen (ADVIL) 800 MG tablet Take 800 mg by mouth every 8 (eight) hours as needed for moderate pain (pain score 4-6). 04/14/23  Yes [provider]  MELATONIN PO Take 1 tablet by mouth at bedtime as needed (sleep).   Yes [provider]  chlorhexidine (PERIDEX) 0.12 % solution Use as directed 5 mLs in the mouth or throat 2 (two) times daily. Patient not taking:  Reported on 04/23/2023 04/14/23   [provider]      Allergies    Patient has no known allergies.    Review of Systems   Review of Systems  Constitutional:  Negative for chills, fatigue and fever.  Respiratory:  Negative for cough, chest tightness, shortness of breath and wheezing.   Cardiovascular:  Negative for chest pain and palpitations.  Gastrointestinal:  Negative for abdominal pain, constipation, diarrhea, nausea and vomiting.  Neurological:  Negative for dizziness, seizures, weakness, light-headedness, numbness and headaches.    Physical Exam Updated Vital Signs BP (!) 125/105   Pulse (!) 119   Temp 98.8 F (37.1 C)   Resp 20   LMP 06/06/2014 (Exact Date)   SpO2 100%  Physical Exam Vitals and nursing note reviewed.  Constitutional:      General: She is not in acute distress.    Appearance: Normal appearance. She is not ill-appearing.  HENT:     Head: Normocephalic and atraumatic.     Right Ear: Tympanic membrane, ear canal and external ear normal.     Left Ear: Tympanic membrane, ear canal and external ear normal.     Nose: Nose normal.     Mouth/Throat:     Mouth: Mucous membranes are moist.     Pharynx: No oropharyngeal exudate or posterior oropharyngeal erythema.     Tonsils: Tonsillar abscess (right) present. 3+ on the right. 1+ on the left.     Comments: Maintaining secretions. No stridor Eyes:  General: No scleral icterus.       Right eye: No discharge.        Left eye: No discharge.     Conjunctiva/sclera: Conjunctivae normal.  Cardiovascular:     Rate and Rhythm: Tachycardia present.     Pulses: Normal pulses.  Pulmonary:     Effort: Pulmonary effort is normal. No respiratory distress.     Breath sounds: Normal breath sounds. No stridor. No wheezing or rhonchi.     Comments: Speaking in full and complete sentences wo difficulty Chest:     Chest wall: No tenderness.  Abdominal:     General: There is no distension.     Palpations:  Abdomen is soft. There is no mass.     Tenderness: There is no abdominal tenderness. There is no guarding.  Musculoskeletal:     Cervical back: Normal range of motion and neck supple. No rigidity.  Lymphadenopathy:     Cervical: No cervical adenopathy.  Skin:    General: Skin is warm.     Capillary Refill: Capillary refill takes less than 2 seconds.     Coloration: Skin is not jaundiced or pale.  Neurological:     Mental Status: She is alert and oriented to person, place, and time. Mental status is at baseline.     ED Results / Procedures / Treatments   Labs (all labs ordered are listed, but only abnormal results are displayed) Labs Reviewed  CBC WITH DIFFERENTIAL/PLATELET - Abnormal; Notable for the following components:      Result Value   WBC 13.3 (*)    Hemoglobin 11.6 (*)    Neutro Abs 12.1 (*)    All other components within normal limits  BASIC METABOLIC PANEL - Abnormal; Notable for the following components:   Glucose, Bld 136 (*)    All other components within normal limits  I-STAT CHEM 8, ED - Abnormal; Notable for the following components:   Glucose, Bld 139 (*)    All other components within normal limits  HCG, QUANTITATIVE, PREGNANCY    EKG None  Radiology CT Soft Tissue Neck W Contrast Result Date: 04/23/2023 CLINICAL DATA:  Soft tissue infection suspected EXAM: CT NECK WITH CONTRAST TECHNIQUE: Multidetector CT imaging of the neck was performed using the standard protocol following the bolus administration of intravenous contrast. RADIATION DOSE REDUCTION: This exam was performed according to the departmental dose-optimization program which includes automated exposure control, adjustment of the mA and/or kV according to patient size and/or use of iterative reconstruction technique. CONTRAST:  75mL OMNIPAQUE IOHEXOL 350 MG/ML SOLN COMPARISON:  03/11/2018 FINDINGS: Pharynx and larynx: Right palatine peritonsillar abscess measuring 2.2 x 1.6 cm. No retropharyngeal  abnormality. The epiglottis is normal. Salivary glands: No inflammation, mass, or stone. Thyroid: Normal. Lymph nodes: Right-greater-than-left reactive cervical lymph nodes Vascular: Negative. Limited intracranial: Negative. Visualized orbits: Negative. Mastoids and visualized paranasal sinuses: Clear. Skeleton: No acute or aggressive process. Upper chest: Negative. Other: None. IMPRESSION: Right palatine peritonsillar abscess measuring 2.2 x 1.6 cm. Electronically Signed   By: Deatra Robinson M.D.   On: 04/23/2023 20:06    Procedures Procedures    Medications Ordered in ED Medications  iohexol (OMNIPAQUE) 350 MG/ML injection 75 mL (75 mLs Intravenous Contrast Given 04/23/23 1738)  ketorolac (TORADOL) 30 MG/ML injection 15 mg (15 mg Intravenous Given 04/23/23 1956)  alum & mag hydroxide-simeth (MAALOX/MYLANTA) 200-200-20 MG/5ML suspension 30 mL (30 mLs Oral Given 04/23/23 1955)    And  lidocaine (XYLOCAINE) 2 % viscous mouth solution  15 mL (15 mLs Oral Given 04/23/23 1955)  ondansetron (ZOFRAN) injection 4 mg (4 mg Intravenous Given 04/23/23 1956)    ED Course/ Medical Decision Making/ A&P                                 Medical Decision Making Risk OTC drugs. Prescription drug management.   Patient presents to the ED for concern of sore throat, this involves an extensive number of treatment options, and is a complaint that carries with it a high risk of complications and morbidity.  The differential diagnosis includes strep, pharyngitis, PTA, retropharyngeal abscess   Co morbidities that complicate the patient evaluation  Recent tooth extraction Recent amox and ibuprofen prescriptions   Additional history obtained:  Additional history obtained from Nursing and Outside Medical Records   External records from outside source obtained and reviewed including  Triage RN note UC note from today    Lab Tests:  I Ordered, and personally interpreted labs.  The pertinent results  include:   Leukocytosis of 13.3 Hgb 11.6 CBG 136   Imaging Studies ordered:  I ordered imaging studies including CT soft tissue  I independently visualized and interpreted imaging which showed 2.2x1.6cm right palatine peritonsillar abscess I agree with the radiologist interpretation    Medicines ordered and prescription drug management:  I ordered medication including augmentin, toradol, maalox  for abscess, pain, reflux Reevaluation of the patient after these medicines showed that the patient improved I have reviewed the patients home medicines and have made adjustments as needed    Consultations Obtained:  I requested consultation with ENT Dr. Jearld Fenton,  and discussed lab and imaging findings as well as pertinent plan - they recommend: drainage of abscess and Augmentin prescription   Problem List / ED Course:  Peritonsillar abscess Consulted ENT who reviewed CT and drained abscess. Recommended augmentin prescription and outpatient f/u if symptoms do not improve in 3-4 days Discussed ED workup, disposition, return to ED precautions with patient who expresses understanding agrees with plan.  All questions answered to their satisfaction.  They are agreeable to plan.  Discharge instructions provided on paperwork   Reevaluation:  After the interventions noted above, I reevaluated the patient and found that they have :improved    Dispostion:  After consideration of the diagnostic results and the patients response to treatment, I feel that the patent would benefit from outpatient augmentin and f/u with ENT if symptoms do not improve.   Final Clinical Impression(s) / ED Diagnoses Final diagnoses:  Peritonsillar abscess    Rx / DC Orders ED Discharge Orders          Ordered    amoxicillin-clavulanate (AUGMENTIN) 875-125 MG tablet  Every 12 hours        04/23/23 2134              Judithann Sheen, PA 04/23/23 2145    Rolan Bucco, MD 04/24/23 0003

## 2023-04-23 NOTE — ED Provider Triage Note (Signed)
 Emergency Medicine Provider Triage Evaluation Note  [name] [name] , a 43 y.o. female  was evaluated in triage.  Pt complains of peritonsillar abscess.  Review of Systems  Positive:  Negative:   Physical Exam  BP (!) 141/116   Pulse 99   Temp 98.8 F (37.1 C)   Resp 18   LMP 06/06/2014 (Exact Date)   SpO2 100%  Gen:   Awake, no distress   Resp:  Normal effort  MSK:   Moves extremities without difficulty  Other:    Medical Decision Making  Medically screening exam initiated at 4:37 PM.  Appropriate orders placed.  KATHALENE SPORER was informed that the remainder of the evaluation will be completed by another provider, this initial triage assessment does not replace that evaluation, and the importance of remaining in the ED until their evaluation is complete.  UC provider note "You were given a shot of Rocephin 1gm and steroid today- you will need to go to emergency room to have a CT soft tissue neck with contrast, evaluated by ENT, and possibly have your peritonsillar abscess drained.Go to ED now"  Tooth extraction 3/3. Swelling started 3/4.   Valrie Hart F, [place] 04/23/23 1640

## 2023-05-03 ENCOUNTER — Ambulatory Visit (HOSPITAL_COMMUNITY)

## 2023-05-05 ENCOUNTER — Other Ambulatory Visit: Payer: Self-pay | Admitting: Family Medicine

## 2023-05-05 DIAGNOSIS — Z0000 Encounter for general adult medical examination without abnormal findings: Secondary | ICD-10-CM

## 2023-05-06 ENCOUNTER — Encounter

## 2023-05-09 ENCOUNTER — Encounter

## 2023-05-24 DIAGNOSIS — Z419 Encounter for procedure for purposes other than remedying health state, unspecified: Secondary | ICD-10-CM | POA: Diagnosis not present

## 2023-05-29 ENCOUNTER — Ambulatory Visit: Attending: Physician Assistant | Admitting: Physical Therapy

## 2023-06-23 DIAGNOSIS — Z419 Encounter for procedure for purposes other than remedying health state, unspecified: Secondary | ICD-10-CM | POA: Diagnosis not present

## 2023-06-24 ENCOUNTER — Ambulatory Visit: Admitting: Family Medicine
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
## 2023-06-26 ENCOUNTER — Ambulatory Visit: Attending: Physician Assistant

## 2023-06-26 DIAGNOSIS — M542 Cervicalgia: Secondary | ICD-10-CM | POA: Insufficient documentation

## 2023-06-26 DIAGNOSIS — M6281 Muscle weakness (generalized): Secondary | ICD-10-CM | POA: Insufficient documentation

## 2023-06-26 DIAGNOSIS — M546 Pain in thoracic spine: Secondary | ICD-10-CM | POA: Diagnosis not present

## 2023-07-01 ENCOUNTER — Ambulatory Visit: Admitting: Physical Therapy
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
## 2023-07-09 ENCOUNTER — Ambulatory Visit: Admitting: Physical Therapy

## 2023-07-10 ENCOUNTER — Telehealth: Payer: Self-pay | Admitting: Physical Therapy

## 2023-07-10 NOTE — Telephone Encounter (Signed)
 Patient missed her appt yesterday and I called today to follow up.  Left message on voicemail reminding her of her next appt and of the attendance policy.   Marci Setter, PT 07/10/23 12:49 PM Phone: [phone] Fax: [phone]

## 2023-07-15 ENCOUNTER — Ambulatory Visit: Attending: Physician Assistant | Admitting: Physical Therapy

## 2023-07-15 ENCOUNTER — Encounter: Payer: Self-pay | Admitting: Physical Therapy

## 2023-07-15 DIAGNOSIS — M6281 Muscle weakness (generalized): Secondary | ICD-10-CM | POA: Insufficient documentation

## 2023-07-15 DIAGNOSIS — M542 Cervicalgia: Secondary | ICD-10-CM | POA: Diagnosis not present

## 2023-07-15 DIAGNOSIS — M546 Pain in thoracic spine: Secondary | ICD-10-CM | POA: Insufficient documentation
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
## 2023-07-17 ENCOUNTER — Encounter: Payer: Self-pay | Admitting: Physical Therapy

## 2023-07-17 ENCOUNTER — Ambulatory Visit: Admitting: Physical Therapy

## 2023-07-17 DIAGNOSIS — M6281 Muscle weakness (generalized): Secondary | ICD-10-CM | POA: Diagnosis not present

## 2023-07-17 DIAGNOSIS — M542 Cervicalgia: Secondary | ICD-10-CM | POA: Diagnosis not present

## 2023-07-17 DIAGNOSIS — M546 Pain in thoracic spine: Secondary | ICD-10-CM

## 2023-07-22 ENCOUNTER — Ambulatory Visit

## 2023-07-22 ENCOUNTER — Telehealth: Payer: Self-pay

## 2023-07-22 NOTE — Telephone Encounter (Signed)
 Attempted to contact pt by phone re: her 2nd no show appt. Pt's voicemail was full and was unable to leave a message.
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
## 2023-07-24 ENCOUNTER — Ambulatory Visit

## 2023-07-24 DIAGNOSIS — Z419 Encounter for procedure for purposes other than remedying health state, unspecified: Secondary | ICD-10-CM | POA: Diagnosis not present

## 2023-08-23 DIAGNOSIS — Z419 Encounter for procedure for purposes other than remedying health state, unspecified: Secondary | ICD-10-CM | POA: Diagnosis not present

## 2023-09-04 ENCOUNTER — Ambulatory Visit (HOSPITAL_COMMUNITY)
Admission: RE | Admit: 2023-09-04 | Discharge: 2023-09-04 | Disposition: A | Discharge status: Home / Self Care | Source: Ambulatory Visit | Attending: Emergency Medicine | Admitting: Emergency Medicine

## 2023-09-04 ENCOUNTER — Encounter (HOSPITAL_COMMUNITY): Payer: Self-pay

## 2023-09-04 VITALS — BP 134/90 | HR 75 | Temp 98.7°F | Resp 16

## 2023-09-04 DIAGNOSIS — Z113 Encounter for screening for infections with a predominantly sexual mode of transmission: Secondary | ICD-10-CM | POA: Diagnosis present

## 2023-09-04 DIAGNOSIS — N898 Other specified noninflammatory disorders of vagina: Secondary | ICD-10-CM | POA: Diagnosis present

## 2023-09-04 LAB — HIV ANTIBODY (ROUTINE TESTING W REFLEX): HIV Screen 4th Generation wRfx: NONREACTIVE

## 2023-09-04 NOTE — ED Triage Notes (Signed)
 Pt states she has some vaginal discharge X 2 weeks. She would like STI testing cyto and blood work.

## 2023-09-04 NOTE — Discharge Instructions (Signed)
 Your results will come back over the next few days and someone will call if results are positive and require treatment.  Return here as needed.

## 2023-09-04 NOTE — ED Provider Notes (Signed)
 [facility]    CSN: [id number] Arrival date & time: 09/04/23  1639      History   Chief Complaint Chief Complaint  Patient presents with   SEXUALLY TRANSMITTED DISEASE   Vaginal Discharge    HPI SHY GUALLPA is a 43 y.o. female.   Patient presents with mild vaginal discharge x 2 weeks.  Patient states that she has noticed a foul odor from the discharge as well.  Patient reports that she is sexually active and is requesting STD testing.  Patient denies any known exposures to STDs.  Denies vaginal lesions, vaginal pain, abnormal vaginal bleeding, dysuria, hematuria, urinary frequency/urgency, abdominal pain, flank pain, and fever.  Patient had a hysterectomy in 2016.  The history is provided by the patient and medical records.  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   Tobacco abuse 06/13/2019   Anemia 10/08/2011    Past Surgical History:  Procedure Laterality Date   ABDOMINAL HYSTERECTOMY     INCISION AND DRAINAGE ABSCESS N/A 06/13/2019   Procedure: exam under anesthesia incision and drainage peri rectal abcess;  Surgeon: Sheldon Standing, MD;  Location: [facility];  Service: General;  Laterality: N/A;   TUBAL LIGATION      OB History     Gravida  6   Para  5   Term  5   Preterm      AB      Living  5      SAB      IAB      Ectopic      Multiple      Live Births  3            Home Medications    Prior to Admission medications   Medication Sig Start Date End Date Taking? Authorizing Provider  acetaminophen -codeine (TYLENOL  #3) 300-30 MG tablet Take 1 tablet by mouth every 6 (six) hours as needed (dental pain). 04/15/23   [provider]  amoxicillin   (AMOXIL ) 500 MG tablet Take 500 mg by mouth every 8 (eight) hours. 04/14/23   [provider]  chlorhexidine  (PERIDEX ) 0.12 % solution Use as directed 5 mLs in the mouth or throat 2 (two) times daily. Patient not taking: Reported on 04/23/2023 04/14/23   [provider]  ibuprofen  (ADVIL ) 800 MG tablet Take 800 mg by mouth every 8 (eight) hours as needed for moderate pain (pain score 4-6). 04/14/23   [provider]  MELATONIN PO Take 1 tablet by mouth at bedtime as needed (sleep).    [provider]    Family History Family History  Problem Relation Age of Onset   Healthy Mother    Diabetes Other    Healthy Father     Social History Social History   Tobacco Use   Smoking status: Some Days    Current packs/day: 0.50    Average packs/day: 0.5 packs/day for 5.0 years (2.5 ttl pk-yrs)    Types: Cigarettes   Smokeless tobacco: Never  Vaping Use   Vaping status: Never Used  Substance Use Topics   Alcohol use: Yes    Comment: occasional   Drug use: Yes  Types: Cocaine    Comment: 3 days ago     Allergies   Patient has no known allergies.   Review of Systems Review of Systems  Genitourinary:  Positive for vaginal discharge.   Per HPI  Physical Exam Triage Vital Signs ED Triage Vitals  Encounter Vitals Group     BP 09/04/23 1707 (!) 134/90     Girls Systolic BP Percentile --      Girls Diastolic BP Percentile --      Boys Systolic BP Percentile --      Boys Diastolic BP Percentile --      Pulse Rate 09/04/23 1707 75     Resp 09/04/23 1707 16     Temp 09/04/23 1707 98.7 F (37.1 C)     Temp src --      SpO2 09/04/23 1707 96 %     Weight --      Height --      Head Circumference --      Peak Flow --      Pain Score 09/04/23 1706 0     Pain Loc --      Pain Education --      Exclude from Growth Chart --    No data found.  Updated Vital Signs BP (!) 134/90 (BP Location: Right Arm)   Pulse 75   Temp 98.7 F (37.1 C)   Resp  16   LMP 06/06/2014 (Exact Date)   SpO2 96%   Visual Acuity Right Eye Distance:   Left Eye Distance:   Bilateral Distance:    Right Eye Near:   Left Eye Near:    Bilateral Near:     Physical Exam Vitals and nursing note reviewed.  Constitutional:      General: She is awake. She is not in acute distress.    Appearance: Normal appearance. She is well-developed and well-groomed. She is not ill-appearing.  Genitourinary:    Comments: Exam deferred Neurological:     Mental Status: She is alert.  Psychiatric:        Behavior: Behavior is cooperative.      UC Treatments / Results  Labs (all labs ordered are listed, but only abnormal results are displayed) Labs Reviewed  HIV ANTIBODY (ROUTINE TESTING W REFLEX)  RPR  CERVICOVAGINAL ANCILLARY ONLY    EKG   Radiology No results found.  Procedures Procedures (including critical care time)  Medications Ordered in UC Medications - No data to display  Initial Impression / Assessment and Plan / UC Course  I have reviewed the triage vital signs and the nursing notes.  Pertinent labs & imaging results that were available during my care of the patient were reviewed by me and considered in my medical decision making (see chart for details).     Patient is overall well-appearing.  Vitals are stable.  GU exam deferred.  Patient perform self swab for STD/STI.  HIV and RPR ordered.  Discussed follow-up and return precautions. Final Clinical Impressions(s) / UC Diagnoses   Final diagnoses:  Vaginal discharge  Screening for STD (sexually transmitted disease)     Discharge Instructions      Your results will come back over the next few days and someone will call if results are positive and require treatment.  Return here as needed.     ED Prescriptions   None    PDMP not reviewed this encounter.   Johnie Flaming A, NP 09/04/23 1750

## 2023-09-05 ENCOUNTER — Ambulatory Visit (HOSPITAL_COMMUNITY): Payer: Self-pay

## 2023-09-05 LAB — CERVICOVAGINAL ANCILLARY ONLY
Bacterial Vaginitis (gardnerella): POSITIVE — AB
Candida Glabrata: NEGATIVE
Candida Vaginitis: POSITIVE — AB
Chlamydia: NEGATIVE
Comment: NEGATIVE
Comment: NEGATIVE
Comment: NEGATIVE
Comment: NEGATIVE
Comment: NEGATIVE
Comment: NORMAL
Neisseria Gonorrhea: NEGATIVE
Trichomonas: NEGATIVE

## 2023-09-05 LAB — RPR: RPR Ser Ql: NONREACTIVE

## 2023-09-05 MED ORDER — METRONIDAZOLE 500 MG PO TABS: 500.0000 mg | ORAL_TABLET | Freq: Two times a day (BID) | ORAL | 0 refills | Status: AC

## 2023-09-05 MED ORDER — FLUCONAZOLE 150 MG PO TABS: 150.0000 mg | ORAL_TABLET | Freq: Once | ORAL | 0 refills | Status: AC

## 2023-09-23 DIAGNOSIS — Z419 Encounter for procedure for purposes other than remedying health state, unspecified: Secondary | ICD-10-CM | POA: Diagnosis not present

## 2023-10-24 DIAGNOSIS — Z419 Encounter for procedure for purposes other than remedying health state, unspecified: Secondary | ICD-10-CM | POA: Diagnosis not present

## 2023-12-24 DIAGNOSIS — Z419 Encounter for procedure for purposes other than remedying health state, unspecified: Secondary | ICD-10-CM | POA: Diagnosis not present
